# Patient Record
Sex: Male | Born: 1990 | Race: Black or African American | Hispanic: No | Marital: Single | Smoking: Current every day smoker
Health system: Southern US, Community
[De-identification: ages and names within clinical notes are randomized; demographics above are authoritative.]

## PROBLEM LIST (undated history)

## (undated) DIAGNOSIS — I10 Essential (primary) hypertension: Secondary | ICD-10-CM

---
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
## 2010-08-11 ENCOUNTER — Emergency Department: Payer: Self-pay | Admitting: Emergency Medicine

## 2011-12-31 IMAGING — CR DG CHEST 2V
1 series · 2 of 2 positions shown · non-contrast
Comparison: none

REASON FOR EXAM: chest pain
COMMENTS:

[Series 1: view not recorded · 0.17mm/px · 2 of 2 slices shown]
[im 1/2]
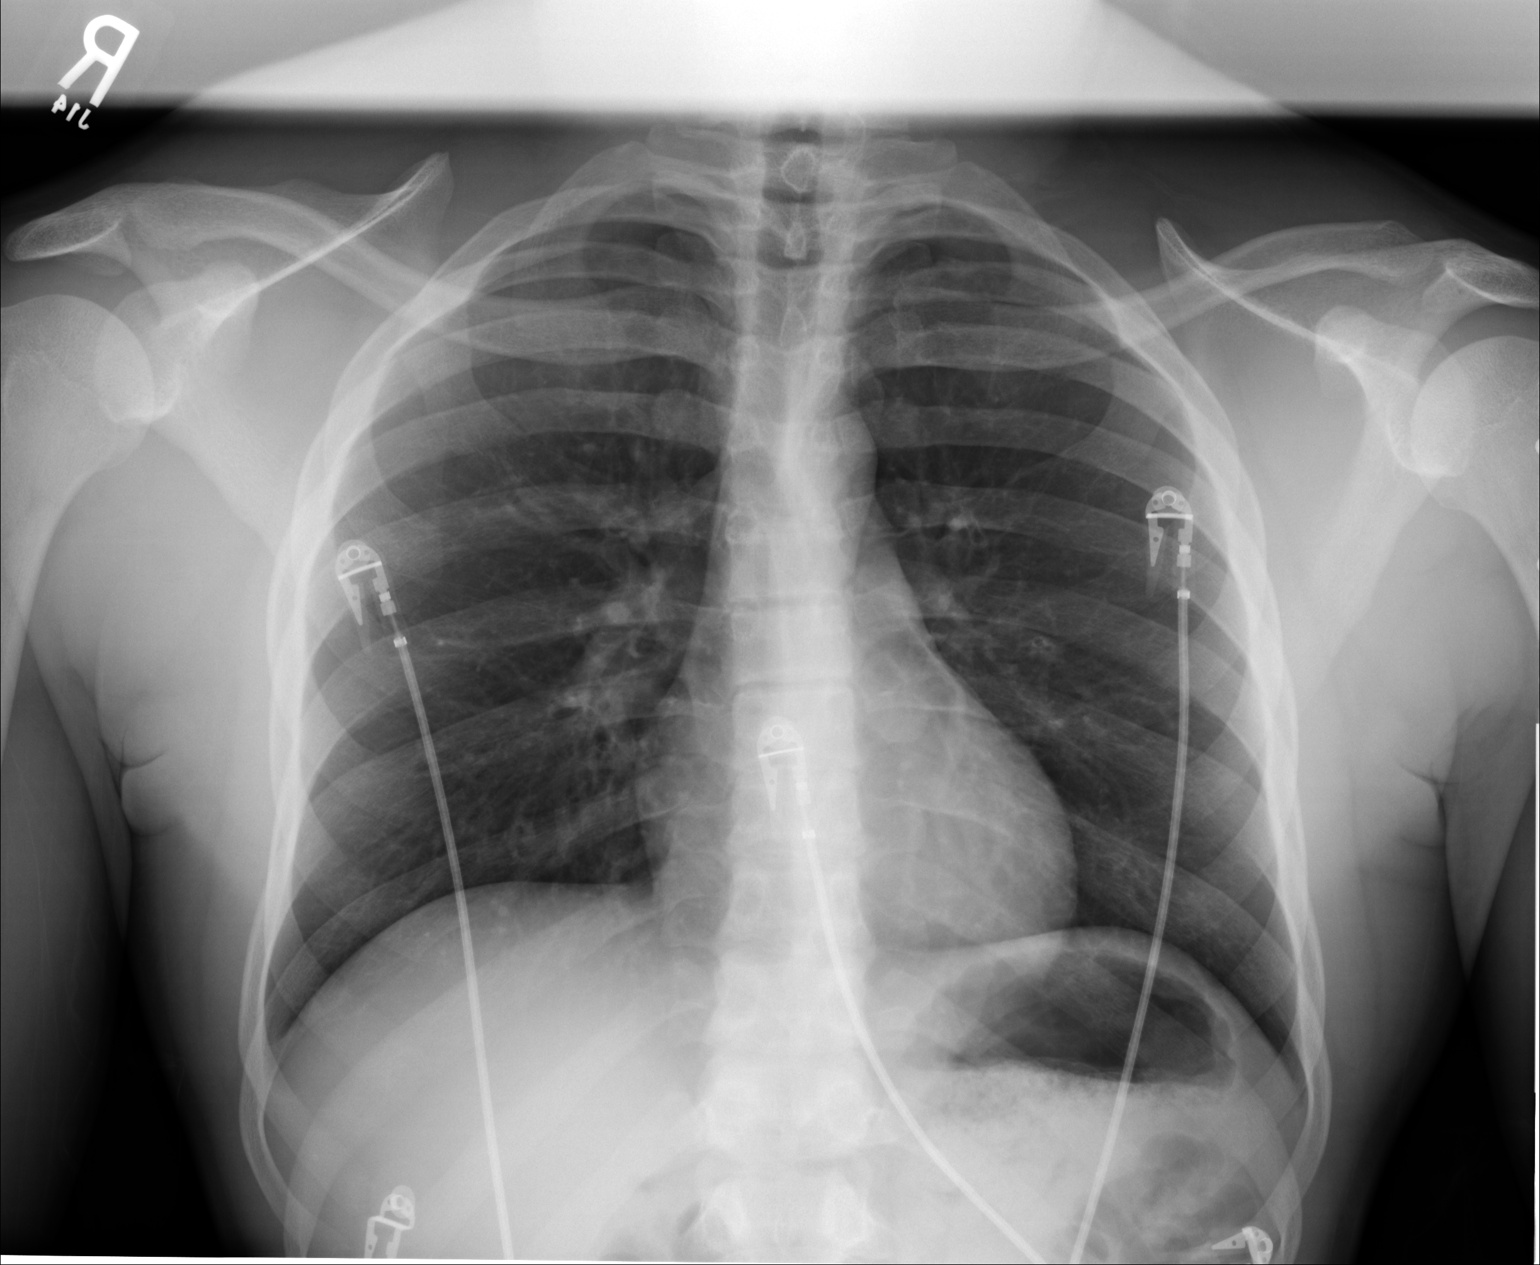
[im 2/2]
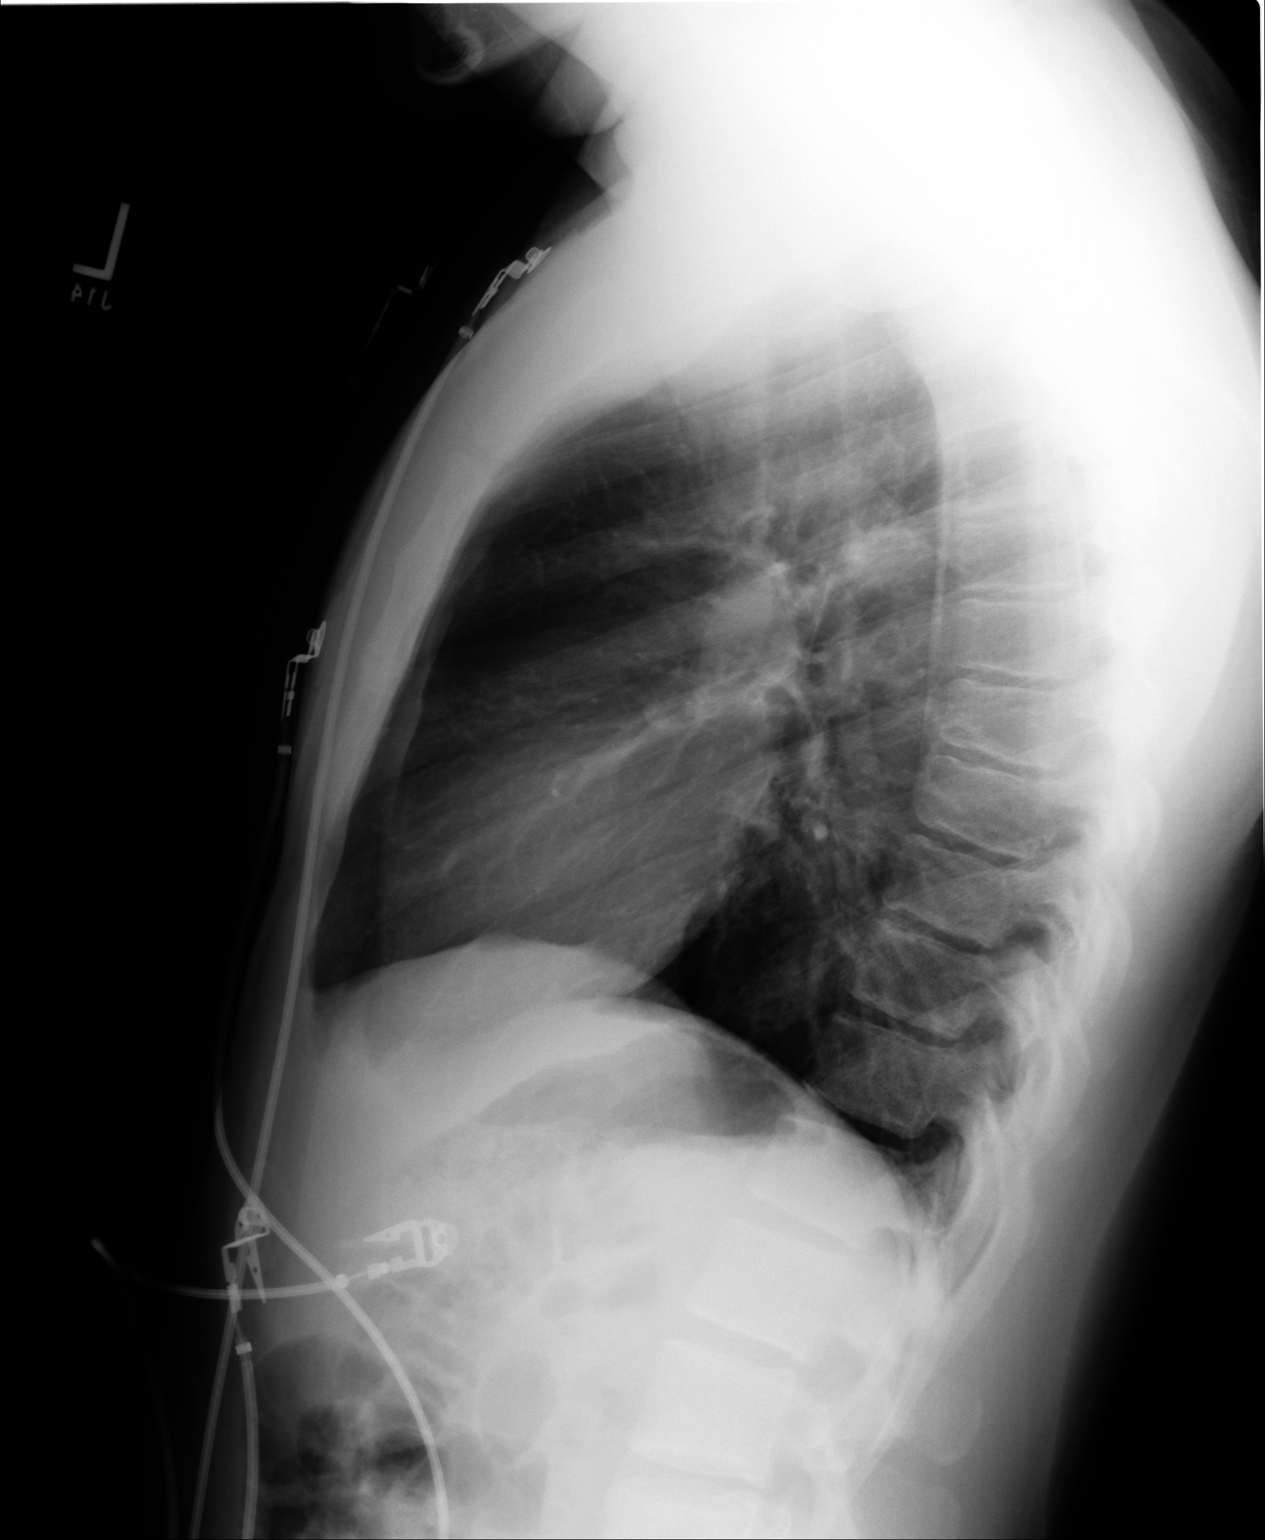

[2 of 2 positions shown; findings below may reference images not displayed]

PROCEDURE:     DXR - DXR CHEST PA (OR AP) AND LATERAL  - August 11, 2010  [DATE]

RESULT:     There is no previous exam for comparison.

Cardiac monitoring electrodes are present. The lungs are clear. The heart
and pulmonary vessels are normal. The bony and mediastinal structures are
unremarkable. There is no effusion. There is no pneumothorax or evidence of
congestive failure.
IMPRESSION: No acute cardiopulmonary disease.

## 2012-05-21 ENCOUNTER — Ambulatory Visit (INDEPENDENT_AMBULATORY_CARE_PROVIDER_SITE_OTHER): Payer: BC Managed Care – PPO | Admitting: Family Medicine

## 2012-05-21 VITALS — BP 152/77 | HR 73 | Temp 98.1°F | Resp 16 | Ht 67.5 in | Wt 173.0 lb

## 2012-05-21 DIAGNOSIS — Z111 Encounter for screening for respiratory tuberculosis: Secondary | ICD-10-CM

## 2012-05-21 NOTE — Progress Notes (Signed)
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 Were you born outside the [place] in one of the following parts of the world:    [place], [place], [place], [place] or [place]?  No  2. Have you traveled outside the [place] and lived for more than one month in one of the following parts of the world:  [place], [place], [place], [place] or [place]?  No  3. Do you have a compromised immune system such as from any of the following conditions:  HIV/AIDS, organ or bone marrow transplantation, diabetes, immunosuppressive   medicines (e.g. Prednisone, Remicaide), leukemia, lymphoma, cancer of the   head or neck, gastrectomy or jejunal bypass, end-stage renal disease (on   dialysis), or silicosis?  No    4. Have you ever done one of the following:    Used crack cocaine, injected illegal drugs, worked or resided in jail or prison,   worked or resided at a homeless shelter, or worked as a Research scientist (physical sciences) in   direct contact with patients?  Yes  Works at [employer]  5. Have you ever been exposed to anyone with infectious tuberculosis?  No   Tuberculosis Symptom Questionnaire  Do you currently have any of the following symptoms?  1. Unexplained cough lasting more than 3 weeks? No  Unexplained fever lasting more than 3 weeks. No   3. Night Sweats (sweating that leaves the bedclothes and sheets wet)   No  4. Shortness of Breath No  5. Chest Pain No  6. Unintentional weight loss  No  7. Unexplained fatigue (very tired for no reason) No

## 2012-10-31 ENCOUNTER — Ambulatory Visit (INDEPENDENT_AMBULATORY_CARE_PROVIDER_SITE_OTHER): Payer: BC Managed Care – PPO | Admitting: Physician Assistant

## 2012-10-31 DIAGNOSIS — J029 Acute pharyngitis, unspecified: Secondary | ICD-10-CM

## 2012-10-31 DIAGNOSIS — H6690 Otitis media, unspecified, unspecified ear: Secondary | ICD-10-CM

## 2012-10-31 MED ORDER — FIRST-DUKES MOUTHWASH MT SUSP: 5.0000 mL | OROMUCOSAL | Status: DC | PRN

## 2012-10-31 MED ORDER — AMOXICILLIN 500 MG PO TABS: 1000.0000 mg | ORAL_TABLET | Freq: Three times a day (TID) | ORAL | Status: DC

## 2012-10-31 NOTE — Patient Instructions (Addendum)
Take amoxicillin twice daily as directed Duke's mouthwash every 2-3 hours as needed for sore throat Increase fluids and rest Continue OTC allergy medication as directed Follow up if symptoms worsen or fail to improve.

## 2012-10-31 NOTE — Progress Notes (Signed)
  Subjective:    Patient ID: Gregory Jones, male    DOB: May 18, 1991, 22 y.o.   MRN: [id number]  HPI 22 year old male presents with 2 day history of sore throat, bilateral ear pain, nasal congestion, and dry, hacking cough.  Admits that he does have a history of ear infections, usually about 3 per year.  Has not noticed any ear drainage or popping.  Did have fever for the first 2 days of his illness (max 102).  Fever has resolved but pain and symptoms have persisted.  He has not taken any OTC medications for this.  No known strep contacts.   He is otherwise healthy with no other concerns today.  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     Review of Systems  Constitutional: Negative for fever and chills.  HENT: Positive for ear pain (bilateral), congestion, sore throat, rhinorrhea and postnasal drip.   Respiratory: Positive for cough. Negative for shortness of breath and wheezing.   Gastrointestinal: Negative for nausea, vomiting and abdominal pain.  Neurological: Negative for dizziness and headaches.       Objective:   Physical Exam  Constitutional: He is oriented to person, place, and time. He appears well-developed and well-nourished.  HENT:  Head: Normocephalic and atraumatic.  Right Ear: Hearing and external ear normal. No drainage. Tympanic membrane is erythematous and bulging. Tympanic membrane is not perforated.  Left Ear: Hearing, external ear and ear canal normal. No drainage. Tympanic membrane is erythematous and bulging. Tympanic membrane is not perforated.  Mouth/Throat: Uvula is midline, oropharynx is clear and moist and mucous membranes are normal.  Eyes: Conjunctivae are normal.  Neck: Normal range of motion.  Cardiovascular: Normal rate, regular rhythm and normal heart sounds.   Pulmonary/Chest: Effort normal and breath sounds normal.  Lymphadenopathy:    He has no cervical adenopathy.  Neurological: He is alert and oriented to person, place, and time.  Psychiatric:  He has a normal mood and affect. His behavior is normal. Judgment and thought content normal.          Assessment & Plan:  Otitis media, bilateral - Plan: amoxicillin (AMOXIL) 500 MG tablet  Acute pharyngitis - Plan: Diphenhyd-Hydrocort-Nystatin (FIRST-DUKES MOUTHWASH) SUSP  Amoxicillin 1 gram twice daily x 10 days Duke's mouthwash q2-3hours prn pain Ibuprofen or tylenol as needed Increase fluids and rest Follow up if symptoms worsen or fail to improve.

## 2012-11-01 ENCOUNTER — Telehealth: Payer: Self-pay | Admitting: Radiology

## 2012-11-01 NOTE — Telephone Encounter (Signed)
Patient provided notes for school and work. Gregory Jones

## 2014-03-23 ENCOUNTER — Emergency Department: Payer: Self-pay | Admitting: Emergency Medicine

## 2014-06-19 ENCOUNTER — Ambulatory Visit (INDEPENDENT_AMBULATORY_CARE_PROVIDER_SITE_OTHER): Payer: BC Managed Care – PPO | Admitting: Internal Medicine

## 2014-06-19 ENCOUNTER — Ambulatory Visit (INDEPENDENT_AMBULATORY_CARE_PROVIDER_SITE_OTHER): Payer: BC Managed Care – PPO

## 2014-06-19 VITALS — BP 163/98 | HR 99 | Resp 16 | Ht 68.0 in | Wt 192.0 lb

## 2014-06-19 DIAGNOSIS — M542 Cervicalgia: Secondary | ICD-10-CM

## 2014-06-19 DIAGNOSIS — M79642 Pain in left hand: Secondary | ICD-10-CM

## 2014-06-19 DIAGNOSIS — S60512A Abrasion of left hand, initial encounter: Secondary | ICD-10-CM

## 2014-06-19 DIAGNOSIS — S80211A Abrasion, right knee, initial encounter: Secondary | ICD-10-CM

## 2014-06-19 DIAGNOSIS — M25561 Pain in right knee: Secondary | ICD-10-CM

## 2014-06-19 DIAGNOSIS — T148XXA Other injury of unspecified body region, initial encounter: Secondary | ICD-10-CM

## 2014-06-19 DIAGNOSIS — M79641 Pain in right hand: Secondary | ICD-10-CM

## 2014-06-19 MED ORDER — OXYCODONE-ACETAMINOPHEN 5-325 MG PO TABS: 1.0000 | ORAL_TABLET | Freq: Four times a day (QID) | ORAL | Status: DC | PRN

## 2014-06-19 MED ORDER — SILVER SULFADIAZINE 1 % EX CREA: 1.0000 "application " | TOPICAL_CREAM | Freq: Every day | CUTANEOUS | Status: DC

## 2014-06-19 MED ORDER — KETOROLAC TROMETHAMINE 60 MG/2ML IM SOLN
60.0000 mg | Freq: Once | INTRAMUSCULAR | Status: AC
  Administered 2014-06-19: 60 mg via INTRAMUSCULAR
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
## 2014-06-21 ENCOUNTER — Telehealth: Payer: Self-pay

## 2014-06-21 NOTE — Telephone Encounter (Signed)
Patient is having pain in legs, patient can barely walk. Also, lacerations are leaking through bandages, they are worried about possible infections. Lastly, patients neck cramps up to where he cannot move it.

## 2014-06-22 ENCOUNTER — Ambulatory Visit (INDEPENDENT_AMBULATORY_CARE_PROVIDER_SITE_OTHER): Payer: BC Managed Care – PPO

## 2014-06-22 ENCOUNTER — Ambulatory Visit (INDEPENDENT_AMBULATORY_CARE_PROVIDER_SITE_OTHER): Payer: BC Managed Care – PPO | Admitting: Family Medicine

## 2014-06-22 VITALS — BP 122/76 | HR 69 | Temp 98.2°F | Resp 17 | Ht 67.5 in | Wt 191.0 lb

## 2014-06-22 DIAGNOSIS — T1490 Injury, unspecified: Secondary | ICD-10-CM

## 2014-06-22 DIAGNOSIS — T07XXXA Unspecified multiple injuries, initial encounter: Secondary | ICD-10-CM

## 2014-06-22 DIAGNOSIS — M79642 Pain in left hand: Secondary | ICD-10-CM

## 2014-06-22 DIAGNOSIS — T1490XA Injury, unspecified, initial encounter: Secondary | ICD-10-CM

## 2014-06-22 DIAGNOSIS — T148 Other injury of unspecified body region: Secondary | ICD-10-CM

## 2014-06-22 DIAGNOSIS — M25561 Pain in right knee: Secondary | ICD-10-CM

## 2014-06-22 DIAGNOSIS — M542 Cervicalgia: Secondary | ICD-10-CM

## 2014-06-22 DIAGNOSIS — T148XXA Other injury of unspecified body region, initial encounter: Secondary | ICD-10-CM

## 2014-06-22 MED ORDER — OXYCODONE-ACETAMINOPHEN 5-325 MG PO TABS: 1.0000 | ORAL_TABLET | Freq: Four times a day (QID) | ORAL | Status: DC | PRN

## 2014-06-22 MED ORDER — LIDOCAINE 5 % EX OINT: 1.0000 "application " | TOPICAL_OINTMENT | CUTANEOUS | Status: DC | PRN

## 2014-06-22 MED ORDER — LIDOCAINE HCL 2 % EX GEL: 1.0000 "application " | CUTANEOUS | Status: DC | PRN

## 2014-06-22 NOTE — Telephone Encounter (Signed)
Spoke to pt- he is going to [facility] today.
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
## 2014-06-22 NOTE — Telephone Encounter (Signed)
The silvadene may cause increased drainage, but he should return to clinic to evaluate this, called to advise, mobile phone disconnected, called home number. Left message for him to come back in.

## 2014-11-01 ENCOUNTER — Encounter (HOSPITAL_COMMUNITY): Payer: Self-pay | Admitting: Family Medicine

## 2014-11-01 ENCOUNTER — Emergency Department (INDEPENDENT_AMBULATORY_CARE_PROVIDER_SITE_OTHER)
Admission: EM | Admit: 2014-11-01 | Discharge: 2014-11-01 | Disposition: A | Discharge status: Home / Self Care | Payer: PRIVATE HEALTH INSURANCE | Source: Home / Self Care | Attending: Family Medicine | Admitting: Family Medicine

## 2014-11-01 DIAGNOSIS — I10 Essential (primary) hypertension: Secondary | ICD-10-CM | POA: Diagnosis not present

## 2014-11-01 DIAGNOSIS — Z720 Tobacco use: Secondary | ICD-10-CM | POA: Diagnosis not present

## 2014-11-01 HISTORY — DX: Essential (primary) hypertension: I10

## 2014-11-01 LAB — POCT I-STAT, CHEM 8
BUN: 15 mg/dL (ref 6–23)
CREATININE: 1 mg/dL (ref 0.50–1.35)
Calcium, Ion: 1.21 mmol/L (ref 1.12–1.23)
Chloride: 103 mmol/L (ref 96–112)
Glucose, Bld: 105 mg/dL — ABNORMAL HIGH (ref 70–99)
HEMATOCRIT: 53 % — AB (ref 39.0–52.0)
Hemoglobin: 18 g/dL — ABNORMAL HIGH (ref 13.0–17.0)
Potassium: 4.2 mmol/L (ref 3.5–5.1)
Sodium: 140 mmol/L (ref 135–145)
TCO2: 23 mmol/L (ref 0–100)

## 2014-11-01 MED ORDER — NICOTINE 21 MG/24HR TD PT24: 21.0000 mg | MEDICATED_PATCH | Freq: Every day | TRANSDERMAL | Status: DC

## 2014-11-01 MED ORDER — HYDROCHLOROTHIAZIDE 12.5 MG PO CAPS: 12.5000 mg | ORAL_CAPSULE | Freq: Every day | ORAL | Status: DC

## 2014-11-01 NOTE — ED Provider Notes (Signed)
CSN: [id number]     Arrival date & time 11/01/14  1216 History   First MD Initiated Contact with Patient 11/01/14 1304     Chief Complaint  Patient presents with  . Hypertension   (Consider location/radiation/quality/duration/timing/severity/associated sxs/prior Treatment) HPI  Chronic history of hypertension. Patient is taking his blood pressure over the last couple of weeks with readings as high as systolic blood pressure of 170-180 and diastolic pressure as high as 95. Denies chest pain, palpitations, shortness of breath, nausea, vomiting, back pain, abdominal pain. Patient states he does with his blood pressure runs high as it oftentimes gives him a headache. Was on HCTZ 0.5 for several years but ran out of this medication and had an insurance change so was unable to go to his PCP. He currently does not have a PCP. But finally has insurance coverage.  Tobacco use: Patient smokes 1 pack per day. Patient actively trying to quit. Patient has not tried anything yet.  Past Medical History  Diagnosis Date  . Hypertension    History reviewed. No pertinent past surgical history. Family History  Problem Relation Age of Onset  . Cancer Neg Hx   . Diabetes Neg Hx   . Heart failure Neg Hx   . Hyperlipidemia Neg Hx   . Hypertension Neg Hx    History  Substance Use Topics  . Smoking status: Current Every Day Smoker -- 2.00 packs/day for 5 years    Types: Cigarettes  . Smokeless tobacco: Not on file  . Alcohol Use: 0.0 oz/week    0 Standard drinks or equivalent per week     Comment: occasionally on the weekend    Review of Systems Per HPI with all other pertinent systems negative.   Allergies  Review of patient's allergies indicates no known allergies.  Home Medications   Prior to Admission medications   Medication Sig Start Date End Date Taking? Authorizing Provider  hydrochlorothiazide (MICROZIDE) 12.5 MG capsule Take 1 capsule (12.5 mg total) by mouth daily. 11/01/14   Ozella Rocksavid J  Keo Schirmer, MD  nicotine (EQ NICOTINE) 21 mg/24hr patch Place 1 patch (21 mg total) onto the skin daily. 11/01/14   Ozella Rocksavid J Obadiah Dennard, MD   BP 133/72 mmHg  Pulse 91  Temp(Src) 98.8 F (37.1 C) (Oral)  Resp 16  SpO2 99% Physical Exam Physical Exam  Constitutional: oriented to person, place, and time. appears well-developed and well-nourished. No distress.  HENT:  Head: Normocephalic and atraumatic.  Eyes: EOMI. PERRL.  Neck: Normal range of motion.  Cardiovascular: RRR, no m/r/g, 2+ distal pulses,  Pulmonary/Chest: Effort normal and breath sounds normal. No respiratory distress.  Abdominal: Soft. Bowel sounds are normal. NonTTP, no distension.  Musculoskeletal: Normal range of motion. Non ttp, no effusion.  Neurological: alert and oriented to person, place, and time.  Skin: Skin is warm. No rash noted. non diaphoretic.  Psychiatric: normal mood and affect. behavior is normal. Judgment and thought content normal.   ED Course  Procedures (including critical care time) Labs Review Labs Reviewed - No data to display  Imaging Review No results found.   MDM   1. Essential hypertension   2. Tobacco use    Chem-8 normal Restart hydrochlorothiazide 12.5 mg daily. Patient to follow-up with PCP for further lab monitoring of blood pressure monitoring. Tobacco cessation counseling given. Patient to try nicotine patch replacement.    Ozella Rocksavid J Benjermin Korber, MD 11/01/14 [phone]

## 2014-11-01 NOTE — ED Notes (Signed)
[facility].  Last night it was 150/95.  BP normal today, but is concerned.  Has had headaches off and on and he thinks it was related to hypertension.  Parents do not have it.

## 2014-11-01 NOTE — Discharge Instructions (Signed)
You need to restart your hydrochlorothiazide for your blood pressure. Please start using the nicotine patch or another form of nicotine replacement to help you quit smoking. Please call your physician's office for follow-up appointment or stop his care with a new physician.

## 2015-11-11 IMAGING — CR DG KNEE COMPLETE 4+V*R*
4 series · 4 of 4 positions shown · non-contrast
Comparison: None.

CLINICAL DATA: Right knee pain.

EXAM:
RIGHT KNEE - COMPLETE 4+ VIEW

[AP]
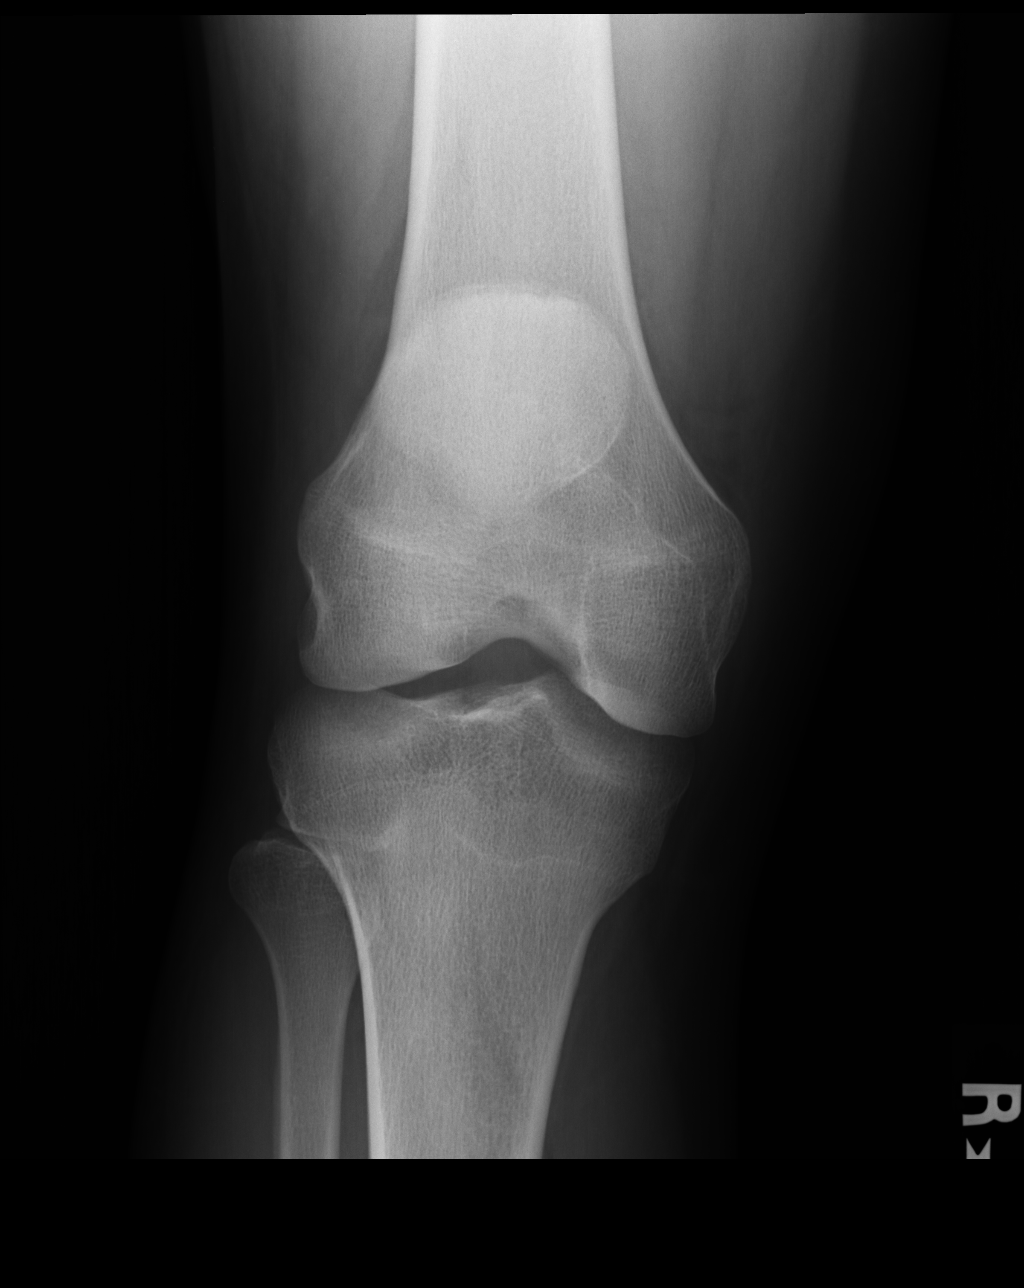

[ap axial]
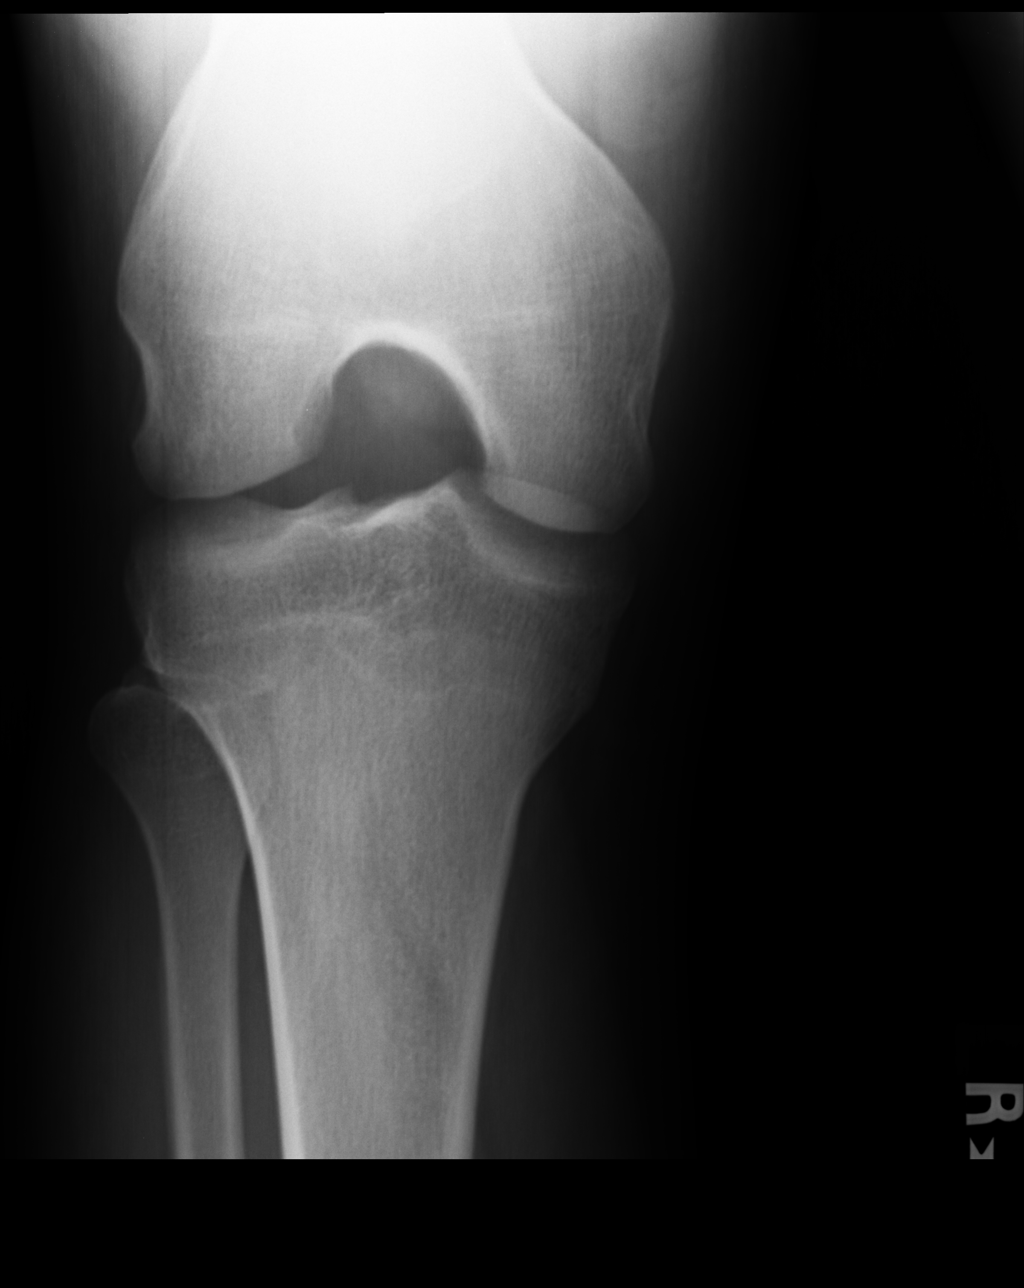

[lateral]
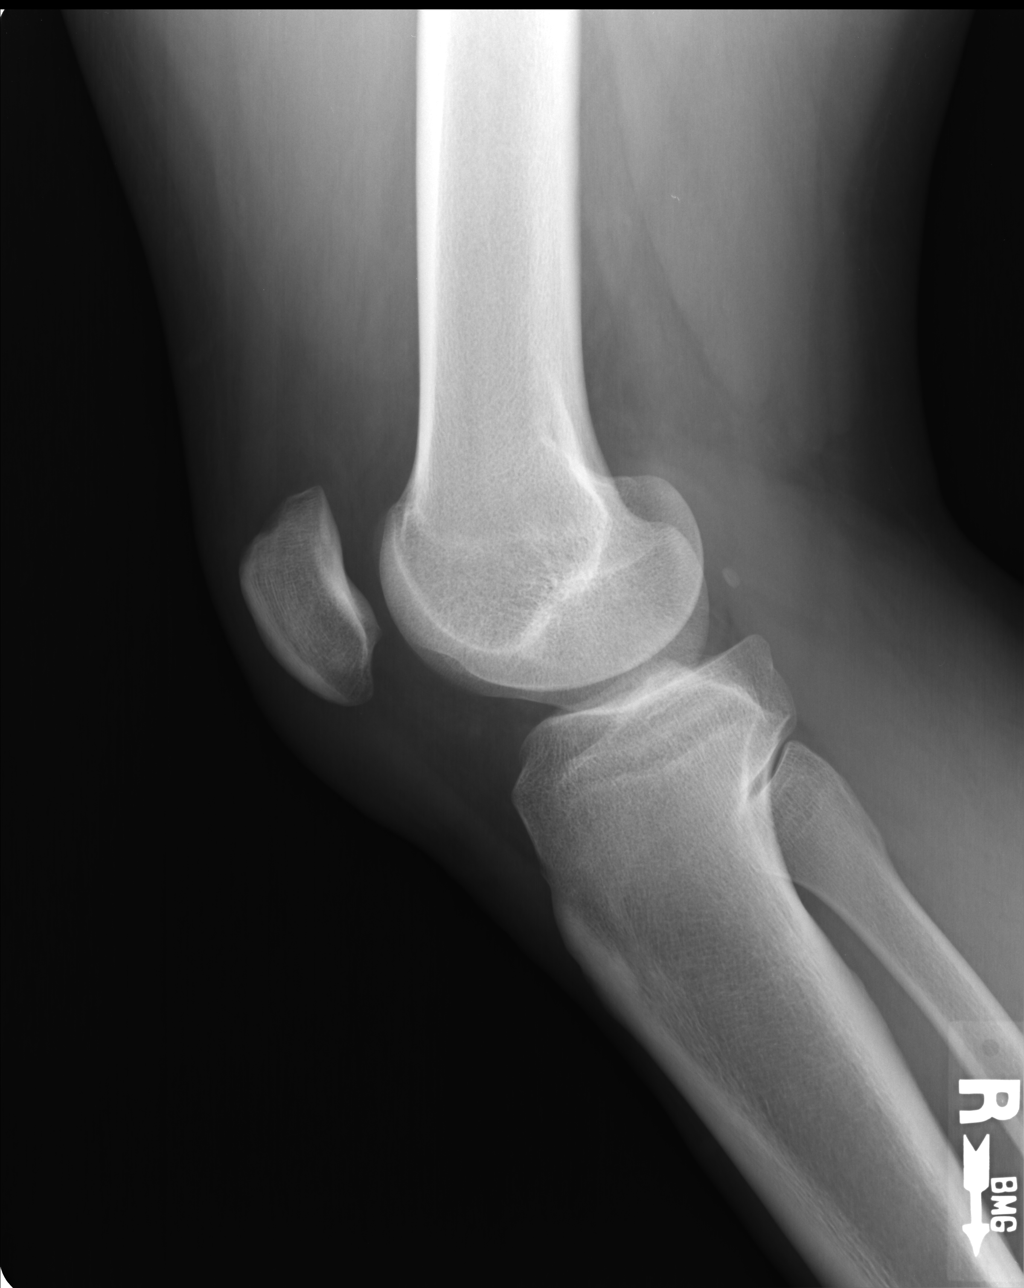

[sunrise]
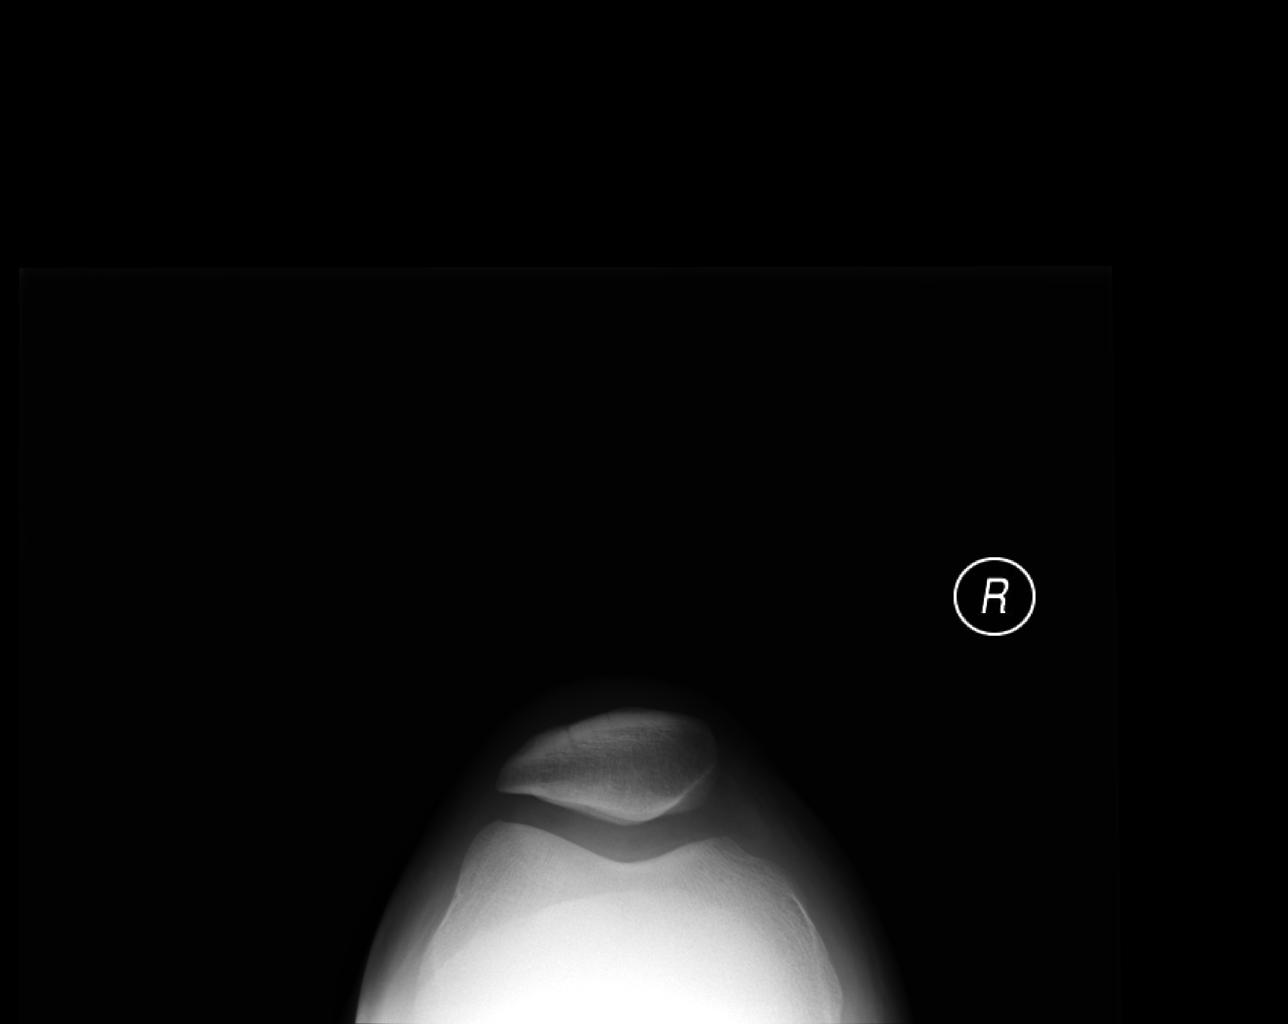

[4 of 4 positions shown; findings below may reference images not displayed]

FINDINGS: No acute bony or joint abnormality identified. No evidence of
fracture . No evidence of dislocation.
IMPRESSION: No acute abnormality.

## 2019-05-09 ENCOUNTER — Emergency Department
Admission: EM | Admit: 2019-05-09 | Discharge: 2019-05-09 | Disposition: A | Discharge status: Home / Self Care | Payer: PRIVATE HEALTH INSURANCE | Attending: Emergency Medicine | Admitting: Emergency Medicine

## 2019-05-09 ENCOUNTER — Other Ambulatory Visit: Payer: Self-pay

## 2019-05-09 ENCOUNTER — Encounter: Payer: Self-pay | Admitting: *Deleted

## 2019-05-09 DIAGNOSIS — Z79899 Other long term (current) drug therapy: Secondary | ICD-10-CM | POA: Insufficient documentation

## 2019-05-09 DIAGNOSIS — Z711 Person with feared health complaint in whom no diagnosis is made: Secondary | ICD-10-CM

## 2019-05-09 DIAGNOSIS — Z113 Encounter for screening for infections with a predominantly sexual mode of transmission: Secondary | ICD-10-CM | POA: Insufficient documentation

## 2019-05-09 DIAGNOSIS — F17210 Nicotine dependence, cigarettes, uncomplicated: Secondary | ICD-10-CM | POA: Insufficient documentation

## 2019-05-09 DIAGNOSIS — I10 Essential (primary) hypertension: Secondary | ICD-10-CM | POA: Insufficient documentation

## 2019-05-09 DIAGNOSIS — Z202 Contact with and (suspected) exposure to infections with a predominantly sexual mode of transmission: Secondary | ICD-10-CM | POA: Insufficient documentation

## 2019-05-09 NOTE — ED Provider Notes (Signed)
[facility] Emergency Department Provider Note  ____________________________________________  Time seen: Approximately 10:59 PM  I have reviewed the triage vital signs and the nursing notes.   HISTORY  Chief Complaint Exposure to STD    HPI Gregory Jones is a 28 y.o. male who presents the emergency department concern for STDs.  Patient has no symptoms at this time but states that he has been having unprotected sex recently.  He is most concerned as he had a casual hookup with someone that he is not very familiar with.  Again, patient denies any symptoms at this time of dysuria, polyuria, hematuria, penile drainage, testicular pain, abdominal pain.  No rashes or lesions.  Patient is here for STD testing only.  No other complaints.         Past Medical History:  Diagnosis Date  . Hypertension     There are no active problems to display for this patient.   History reviewed. No pertinent surgical history.  Prior to Admission medications   Medication Sig Start Date End Date Taking? Authorizing Provider  hydrochlorothiazide (MICROZIDE) 12.5 MG capsule Take 1 capsule (12.5 mg total) by mouth daily. 11/01/14   Ozella Rocks, MD  nicotine (EQ NICOTINE) 21 mg/24hr patch Place 1 patch (21 mg total) onto the skin daily. 11/01/14   Ozella Rocks, MD    Allergies Patient has no known allergies.  Family History  Problem Relation Age of Onset  . Cancer Neg Hx   . Diabetes Neg Hx   . Heart failure Neg Hx   . Hyperlipidemia Neg Hx   . Hypertension Neg Hx     Social History Social History   Tobacco Use  . Smoking status: Current Every Day Smoker    Packs/day: 2.00    Years: 5.00    Pack years: 10.00    Types: Cigarettes  . Smokeless tobacco: Never Used  Substance Use Topics  . Alcohol use: Yes    Alcohol/week: 0.0 standard drinks    Comment: occasionally on the weekend  . Drug use: No     Review of Systems  Constitutional: No  fever/chills Eyes: No visual changes. No discharge ENT: No upper respiratory complaints. Cardiovascular: no chest pain. Respiratory: no cough. No SOB. Gastrointestinal: No abdominal pain.  No nausea, no vomiting.  No diarrhea.  No constipation. Genitourinary: Negative for dysuria. No hematuria.  No testicular pain.  No penile drainage.  No lesions or chancres. Musculoskeletal: Negative for musculoskeletal pain. Skin: Negative for rash, abrasions, lacerations, ecchymosis. Neurological: Negative for headaches, focal weakness or numbness. 10-point ROS otherwise negative.  ____________________________________________   PHYSICAL EXAM:  VITAL SIGNS: ED Triage Vitals  Enc Vitals Group     BP 05/09/19 2143 (!) 160/104     Pulse Rate 05/09/19 2143 71     Resp 05/09/19 2143 18     Temp 05/09/19 2143 98.6 F (37 C)     Temp Source 05/09/19 2143 Oral     SpO2 05/09/19 2143 98 %     Weight 05/09/19 2144 200 lb (90.7 kg)     Height 05/09/19 2144 5\' 7"  (1.702 m)     Head Circumference --      Peak Flow --      Pain Score 05/09/19 2149 0     Pain Loc --      Pain Edu? --      Excl. in GC? --      Constitutional: Alert and oriented. Well appearing and in  no acute distress. Eyes: Conjunctivae are normal. PERRL. EOMI. Head: Atraumatic. ENT:      Ears:       Nose: No congestion/rhinnorhea.      Mouth/Throat: Mucous membranes are moist.  Neck: No stridor.    Cardiovascular: Normal rate, regular rhythm. Normal S1 and S2.  Good peripheral circulation. Respiratory: Normal respiratory effort without tachypnea or retractions. Lungs CTAB. Good air entry to the bases with no decreased or absent breath sounds. Gastrointestinal: Bowel sounds 4 quadrants. Soft and nontender to palpation. No guarding or rigidity. No palpable masses. No distention. No CVA tenderness. Genitourinary: No lesions or chancres identified.  No drainage noted.  No tenderness to palpation. Musculoskeletal: Full range of  motion to all extremities. No gross deformities appreciated. Neurologic:  Normal speech and language. No gross focal neurologic deficits are appreciated.  Skin:  Skin is warm, dry and intact. No rash noted. Psychiatric: Mood and affect are normal. Speech and behavior are normal. Patient exhibits appropriate insight and judgement.   ____________________________________________   LABS (all labs ordered are listed, but only abnormal results are displayed)  Labs Reviewed  GC/CHLAMYDIA PROBE AMP   ____________________________________________  EKG   ____________________________________________  RADIOLOGY   No results found.  ____________________________________________    PROCEDURES  Procedure(s) performed:    Procedures    Medications - No data to display   ____________________________________________   INITIAL IMPRESSION / ASSESSMENT AND PLAN / ED COURSE  Pertinent labs & imaging results that were available during my care of the patient were reviewed by me and considered in my medical decision making (see chart for details).  Review of the [place] CSRS was performed in accordance of the [place] prior to dispensing any controlled drugs.           Patient's diagnosis is consistent with concern about STD without diagnosis.  Patient presented to the emergency department completely asymptomatic but worried about STD as he has had unprotected sex.  Patient has no symptoms at this time.  I discussed that we were able to perform gonorrhea and Chlamydia testing but he would need to present to primary care or health department for complete STD panel.  Patient verbalizes understanding of same.  Will test for gonorrhea and chlamydia but at this time patient will not be prescribed any medications or receive treatment in the emergency department as he is completely asymptomatic.  Follow-up primary care or health department as described above..Patient is given ED precautions to return to  the ED for any worsening or new symptoms.     ____________________________________________  FINAL CLINICAL IMPRESSION(S) / ED DIAGNOSES  Final diagnoses:  Concern about STD in male without diagnosis      NEW MEDICATIONS STARTED DURING THIS VISIT:  ED Discharge Orders    None          This chart was dictated using voice recognition software/Dragon. Despite best efforts to proofread, errors can occur which can change the meaning. Any change was purely unintentional.    Darletta Moll, PA-C 05/09/19 2324    Nance Pear, MD 05/09/19 [phone]

## 2019-05-09 NOTE — ED Notes (Signed)
Pt ambulatory from triage, here for "regular check up for STDs", reports no symptoms

## 2019-05-09 NOTE — ED Notes (Signed)
No peripheral IV placed this visit.   Discharge instructions reviewed with patient. Questions fielded by this RN. Patient verbalizes understanding of instructions. Patient discharged home in stable condition per [place], [place]. No acute distress noted at time of discharge.

## 2019-05-09 NOTE — ED Triage Notes (Signed)
Patient denies any STD symptoms, but reports having unprotected sex and wants to be tested.

## 2019-05-13 LAB — GC/CHLAMYDIA PROBE AMP
Chlamydia trachomatis, NAA: NEGATIVE
Neisseria Gonorrhoeae by PCR: NEGATIVE

## 2020-01-15 ENCOUNTER — Ambulatory Visit (HOSPITAL_COMMUNITY)
Admission: EM | Admit: 2020-01-15 | Discharge: 2020-01-15 | Disposition: A | Discharge status: Home / Self Care | Payer: Self-pay | Attending: Physician Assistant | Admitting: Physician Assistant

## 2020-01-15 ENCOUNTER — Other Ambulatory Visit: Payer: Self-pay

## 2020-01-15 ENCOUNTER — Encounter (HOSPITAL_COMMUNITY): Payer: Self-pay | Admitting: Emergency Medicine

## 2020-01-15 DIAGNOSIS — Z760 Encounter for issue of repeat prescription: Secondary | ICD-10-CM

## 2020-01-15 DIAGNOSIS — I10 Essential (primary) hypertension: Secondary | ICD-10-CM

## 2020-01-15 MED ORDER — HYDROCHLOROTHIAZIDE 12.5 MG PO CAPS: 12.5000 mg | ORAL_CAPSULE | Freq: Every day | ORAL | 0 refills | Status: DC

## 2020-01-15 NOTE — ED Triage Notes (Signed)
Patient has been out of blood pressure medicine for a month.  Patient is in between providers and insurance.    Patient has had headaches and intermittent chest pain and dizziness over the past month

## 2020-01-15 NOTE — Discharge Instructions (Signed)
I have refilled your medicine  For future refills you need to follow up with your Primary care.  I have given another option for PCP as well  If you have chest pain, shortness of breath or severe headache go to the ED

## 2020-01-15 NOTE — ED Provider Notes (Signed)
[facility]    CSN: [id number] Arrival date & time: 01/15/20  0932      History   Chief Complaint Chief Complaint  Patient presents with  . Medication Refill    HPI Gregory Jones is a 29 y.o. male.   Patient reports for medication refill.  He reports he has been out of his hydrochlorothiazide for about 1 month.  He reports he does have a primary care however is looking to switch.  He reports he had his medication last filled in January and there was a refill on the prescription.  He reports he ran out of this about a month ago.  He reports blood pressure is well maintained on [facility].  He reports he can tell his blood pressures been up over the last month as he gets headaches and occasional dizziness.  He reports he was surprised to see his blood pressure as low as it was today and is usually higher than this.  He denies any chest pain or shortness of breath today.  Denies any headaches or dizziness today.  Patient reports he had labs done in January and that they were all normal to his knowledge.  He reports he did not follow-up with his primary care as he is not been able to make an appointment and the provider told him that they required him to come in prior to refilling this prescription.      Past Medical History:  Diagnosis Date  . Hypertension     There are no problems to display for this patient.   History reviewed. No pertinent surgical history.     Home Medications    Prior to Admission medications   Medication Sig Start Date End Date Taking? Authorizing Provider  hydrochlorothiazide (MICROZIDE) 12.5 MG capsule Take 1 capsule (12.5 mg total) by mouth daily. 01/15/20   Jacub Waiters, Marguerita Beards, PA-C  nicotine (EQ NICOTINE) 21 mg/24hr patch Place 1 patch (21 mg total) onto the skin daily. 11/01/14   Waldemar Dickens, MD    Family History Family History  Problem Relation Age of Onset  . Cancer Neg Hx   . Diabetes Neg Hx   . Heart failure Neg Hx   .  Hyperlipidemia Neg Hx   . Hypertension Neg Hx     Social History Social History   Tobacco Use  . Smoking status: Current Every Day Smoker    Packs/day: 2.00    Years: 5.00    Pack years: 10.00    Types: Cigarettes  . Smokeless tobacco: Never Used  Substance Use Topics  . Alcohol use: Yes    Alcohol/week: 0.0 standard drinks    Comment: occasionally on the weekend  . Drug use: No     Allergies   Patient has no known allergies.   Review of Systems Review of Systems   Physical Exam Triage Vital Signs ED Triage Vitals  Enc Vitals Group     BP 01/15/20 0949 138/78     Pulse Rate 01/15/20 0949 73     Resp 01/15/20 0949 18     Temp 01/15/20 0949 98.5 F (36.9 C)     Temp Source 01/15/20 0949 Oral     SpO2 01/15/20 0949 100 %     Weight --      Height --      Head Circumference --      Peak Flow --      Pain Score 01/15/20 0946 6     Pain Loc --  Pain Edu? --      Excl. in GC? --    No data found.  Updated Vital Signs BP 138/78 (BP Location: Right Arm)   Pulse 73   Temp 98.5 F (36.9 C) (Oral)   Resp 18   SpO2 100%   Visual Acuity Right Eye Distance:   Left Eye Distance:   Bilateral Distance:    Right Eye Near:   Left Eye Near:    Bilateral Near:     Physical Exam Vitals and nursing note reviewed.  Constitutional:      Appearance: Normal appearance. He is well-developed. He is not ill-appearing.  HENT:     Head: Normocephalic and atraumatic.  Cardiovascular:     Rate and Rhythm: Normal rate and regular rhythm.     Heart sounds: No murmur heard.   Pulmonary:     Effort: Pulmonary effort is normal. No respiratory distress.     Breath sounds: Normal breath sounds.  Abdominal:     Palpations: Abdomen is soft.     Tenderness: There is no abdominal tenderness.  Musculoskeletal:     Cervical back: Neck supple.     Right lower leg: No edema.     Left lower leg: No edema.  Skin:    General: Skin is warm and dry.     Findings: No rash.    Neurological:     Mental Status: He is alert.      UC Treatments / Results  Labs (all labs ordered are listed, but only abnormal results are displayed) Labs Reviewed - No data to display  EKG   Radiology No results found.  Procedures Procedures (including critical care time)  Medications Ordered in UC Medications - No data to display  Initial Impression / Assessment and Plan / UC Course  I have reviewed the triage vital signs and the nursing notes.  Pertinent labs & imaging results that were available during my care of the patient were reviewed by me and considered in my medical decision making (see chart for details).     #Essential hypertension #Medication refill Patient is a 29 year old with history of hypertension presenting medication refill.  Discussed that I will fill his medication for 30 days however otherwise he will need to follow-up with a primary care as he does have an established primary care for future refills.  I also offered an alternative for his current primary care if he wishes to switch.  I offered to pull basic labs, however patient prefers to follow-up with a primary care provider.  Refilled 12.5 Microzide.  Discussed return emergency department precautions.  Patient verbalized understanding. Final Clinical Impressions(s) / UC Diagnoses   Final diagnoses:  Medication refill  Essential hypertension     Discharge Instructions     I have refilled your medicine  For future refills you need to follow up with your Primary care.  I have given another option for PCP as well  If you have chest pain, shortness of breath or severe headache go to the ED      ED Prescriptions    Medication Sig Dispense Auth. Provider   hydrochlorothiazide (MICROZIDE) 12.5 MG capsule  (Status: Discontinued) Take 1 capsule (12.5 mg total) by mouth daily. 30 capsule Nishita Isaacks, Veryl Speak, PA-C   hydrochlorothiazide (MICROZIDE) 12.5 MG capsule Take 1 capsule (12.5 mg total)  by mouth daily. 30 capsule Remigio Mcmillon, Veryl Speak, PA-C     PDMP not reviewed this encounter.   Hermelinda Medicus, PA-C 01/15/20 2328

## 2020-05-17 ENCOUNTER — Other Ambulatory Visit: Payer: Self-pay

## 2020-05-17 ENCOUNTER — Encounter: Payer: Self-pay | Admitting: Internal Medicine

## 2020-05-17 ENCOUNTER — Ambulatory Visit (INDEPENDENT_AMBULATORY_CARE_PROVIDER_SITE_OTHER): Payer: No Typology Code available for payment source | Admitting: Internal Medicine

## 2020-05-17 VITALS — BP 146/86 | HR 90 | Temp 98.6°F | Ht 66.0 in | Wt 194.1 lb

## 2020-05-17 DIAGNOSIS — F17200 Nicotine dependence, unspecified, uncomplicated: Secondary | ICD-10-CM | POA: Diagnosis not present

## 2020-05-17 DIAGNOSIS — I10 Essential (primary) hypertension: Secondary | ICD-10-CM | POA: Diagnosis not present

## 2020-05-17 MED ORDER — HYDROCHLOROTHIAZIDE 12.5 MG PO CAPS: 12.5000 mg | ORAL_CAPSULE | Freq: Every day | ORAL | 2 refills | Status: DC

## 2020-05-17 MED ORDER — BUPROPION HCL ER (SR) 150 MG PO TB12: 150.0000 mg | ORAL_TABLET | Freq: Two times a day (BID) | ORAL | 2 refills | Status: DC

## 2020-05-17 NOTE — Progress Notes (Signed)
New Patient Office Visit  Subjective:  Patient ID: Gregory Jones, male    DOB: 1991/03/01  Age: 29 y.o. MRN: [id number]  CC: HTN, tobacco use disorder    HPI Gregory Jones presents to establish care for management of HTN and tobacco use disorder. Please see problem based charting for further details.   Past Medical History:  Diagnosis Date  . Hypertension     Surgical history: no prior surgeries.  Family History  Problem Relation Age of Onset  . Cancer Neg Hx   . Diabetes Neg Hx   . Heart failure Neg Hx   . Hyperlipidemia Neg Hx   . Hypertension Neg Hx    Social history: works as a [employer], drinks intermittently on social occasions, has smoked 2 ppd for the last 6 years, no illicit substance use. Makes an effort to workout on the weekends. He is in a relationship but not sexually active.    ROS Review of Systems  Constitutional: Negative for activity change, chills, fever and unexpected weight change.  HENT: Negative for sinus pain and sore throat.   Eyes: Negative for visual disturbance.  Respiratory: Negative for cough and shortness of breath.   Cardiovascular: Negative for chest pain and palpitations.  Gastrointestinal: Negative for abdominal pain, constipation and diarrhea.  Endocrine: Negative for polydipsia, polyphagia and polyuria.  Genitourinary: Negative for difficulty urinating.  Musculoskeletal: Negative for arthralgias and gait problem.  Neurological: Negative for dizziness, weakness and headaches.  Psychiatric/Behavioral: Negative for dysphoric mood and sleep disturbance.    Objective:   Today's Vitals: BP (!) 146/86 (BP Location: Left Arm, Cuff Size: Normal)   Pulse 90   Temp 98.6 F (37 C) (Oral)   Ht 5\' 6"  (1.676 m)   Wt 194 lb 1.6 oz (88 kg)   SpO2 100% Comment: room air  BMI 31.33 kg/m   Physical Exam Constitutional:      General: He is not in acute distress.    Appearance: Normal appearance.  HENT:     Nose: Nose normal.      Mouth/Throat:     Mouth: Mucous membranes are moist.     Pharynx: Oropharynx is clear.  Eyes:     Conjunctiva/sclera: Conjunctivae normal.  Cardiovascular:     Rate and Rhythm: Normal rate and regular rhythm.     Pulses: Normal pulses.     Heart sounds: Normal heart sounds.  Pulmonary:     Effort: Pulmonary effort is normal.     Breath sounds: Normal breath sounds.  Abdominal:     General: Bowel sounds are normal. There is no distension.     Palpations: Abdomen is soft.     Tenderness: There is no abdominal tenderness.  Musculoskeletal:        General: Normal range of motion.     Right lower leg: No edema.     Left lower leg: No edema.  Skin:    General: Skin is warm and dry.  Neurological:     General: No focal deficit present.     Mental Status: He is alert.  Psychiatric:        Mood and Affect: Mood normal.        Behavior: Behavior normal.     Assessment & Plan:   Problem List Items Addressed This Visit      Cardiovascular and Mediastinum   Essential hypertension - Primary    Patient states he was first diagnosed with HTN approximately 3 years ago. He was previously on  Microzide 12.5 mg daily which he tolerated well. He has not been on any medication for several months because he was uninsured and unable to establish with a primary care physician.  Blood pressure is elevated today, but not severe. We discussed lifestyle modifications including continued exercise, weight loss and smoking cessation (see additional problem below).  Will resume anti-hypertensive therapy with Microzide 12.5 mg daily. Return in 4 weeks for repeat blood pressure check and BMP.       Relevant Medications   hydrochlorothiazide (MICROZIDE) 12.5 MG capsule     Other   Tobacco use disorder    Patient currently smoking 2 packs per day and is interested in quitting. He has tried the patches which were not helpful, and Chantix which helped with cessation but had nightmares and disturbing thoughts.   Will try Wellbutrin as alternative therapy. He will start with 150 mg daily x3 days, followed by 150 mg BID thereafter. He was instructed to wait until he was at therapeutic dose for a few days prior to initiating cutback.          Outpatient Encounter Medications as of 05/17/2020  Medication Sig  . buPROPion (WELLBUTRIN SR) 150 MG 12 hr tablet Take 1 tablet (150 mg total) by mouth 2 (two) times daily.  . hydrochlorothiazide (MICROZIDE) 12.5 MG capsule Take 1 capsule (12.5 mg total) by mouth daily.  . nicotine (EQ NICOTINE) 21 mg/24hr patch Place 1 patch (21 mg total) onto the skin daily.  . [DISCONTINUED] hydrochlorothiazide (MICROZIDE) 12.5 MG capsule Take 1 capsule (12.5 mg total) by mouth daily.   No facility-administered encounter medications on file as of 05/17/2020.    Follow-up: Return in about 4 weeks (around 06/14/2020) for BMP, BP re-check .   Bridget Hartshorn, DO

## 2020-05-17 NOTE — Patient Instructions (Signed)
Mr. Krapf, It was nice meeting you! Glad to have you establishing care in our clinic.   Today we discussed:   1. Smoking: I'm glad to hear you want to try and cut back with ultimate goal of quitting. Let's try a different medication called Wellbutrin. Start out by taking it once daily for 3 days. Then increase to twice daily if you are tolerating it well. Do not plan on cutting back on your smoking until you are the full dose of the medication.   2. Blood pressure: I'll start you back on what you were previously taking and see how your blood pressure does. We'll bring you back in 4 weeks for labs and blood pressure re-check.   Take care! Dr. Chesley Mires

## 2020-05-18 ENCOUNTER — Encounter: Payer: Self-pay | Admitting: Internal Medicine

## 2020-05-18 DIAGNOSIS — F17200 Nicotine dependence, unspecified, uncomplicated: Secondary | ICD-10-CM | POA: Insufficient documentation

## 2020-05-18 DIAGNOSIS — I10 Essential (primary) hypertension: Secondary | ICD-10-CM | POA: Insufficient documentation

## 2020-05-18 NOTE — Assessment & Plan Note (Signed)
Patient states he was first diagnosed with HTN approximately 3 years ago. He was previously on Microzide 12.5 mg daily which he tolerated well. He has not been on any medication for several months because he was uninsured and unable to establish with a primary care physician.  Blood pressure is elevated today, but not severe. We discussed lifestyle modifications including continued exercise, weight loss and smoking cessation (see additional problem below).  Will resume anti-hypertensive therapy with Microzide 12.5 mg daily. Return in 4 weeks for repeat blood pressure check and BMP.

## 2020-05-18 NOTE — Assessment & Plan Note (Signed)
Patient currently smoking 2 packs per day and is interested in quitting. He has tried the patches which were not helpful, and Chantix which helped with cessation but had nightmares and disturbing thoughts.  Will try Wellbutrin as alternative therapy. He will start with 150 mg daily x3 days, followed by 150 mg BID thereafter. He was instructed to wait until he was at therapeutic dose for a few days prior to initiating cutback.

## 2020-05-24 NOTE — Progress Notes (Signed)
Internal Medicine Clinic Attending  Case discussed with Dr. Bloomfield  At the time of the visit.  We reviewed the resident's history and exam and pertinent patient test results.  I agree with the assessment, diagnosis, and plan of care documented in the resident's note.  

## 2020-06-14 ENCOUNTER — Other Ambulatory Visit: Payer: Self-pay

## 2020-06-14 ENCOUNTER — Encounter: Payer: Self-pay | Admitting: Student

## 2020-06-14 ENCOUNTER — Ambulatory Visit (INDEPENDENT_AMBULATORY_CARE_PROVIDER_SITE_OTHER): Payer: No Typology Code available for payment source | Admitting: Student

## 2020-06-14 VITALS — BP 139/85 | HR 82 | Temp 98.4°F | Ht 66.0 in | Wt 195.0 lb

## 2020-06-14 DIAGNOSIS — I10 Essential (primary) hypertension: Secondary | ICD-10-CM | POA: Diagnosis not present

## 2020-06-14 DIAGNOSIS — F17210 Nicotine dependence, cigarettes, uncomplicated: Secondary | ICD-10-CM

## 2020-06-14 DIAGNOSIS — F17200 Nicotine dependence, unspecified, uncomplicated: Secondary | ICD-10-CM

## 2020-06-14 NOTE — Progress Notes (Signed)
   CC: f/u visit for BP recheck  HPI:  [name] is a 29 y.o. M with history of HTN and tobacco use disorder presenting to the [facility] for a f/u visit to recheck BP and obtain labs (BMP) after being started on microzide 12.5mg  daily for treatment of HTN. Please see individualized A&P for full HPI.  Past Medical History:  Diagnosis Date  . Hypertension    Review of Systems:    Negative aside from that listed in individualized A&P  Physical Exam:  Vitals:   06/14/20 0839  BP: 139/85  Pulse: 82  Temp: 98.4 F (36.9 C)  TempSrc: Oral  SpO2: 99%  Weight: 195 lb (88.5 kg)  Height: 5\' 6"  (1.676 m)   Physical Exam Constitutional:      Appearance: He is obese.  HENT:     Head: Normocephalic and atraumatic.     Mouth/Throat:     Mouth: Mucous membranes are moist.     Pharynx: Oropharynx is clear. No oropharyngeal exudate.  Eyes:     Extraocular Movements: Extraocular movements intact.     Conjunctiva/sclera: Conjunctivae normal.     Pupils: Pupils are equal, round, and reactive to light.  Cardiovascular:     Rate and Rhythm: Normal rate and regular rhythm.     Pulses: Normal pulses.     Heart sounds: Normal heart sounds. No murmur heard.  No gallop.   Pulmonary:     Effort: Pulmonary effort is normal.     Breath sounds: Normal breath sounds. No wheezing, rhonchi or rales.  Abdominal:     General: Bowel sounds are normal. There is no distension.     Palpations: Abdomen is soft.     Tenderness: There is no abdominal tenderness. There is no guarding or rebound.  Musculoskeletal:        General: No swelling. Normal range of motion.  Skin:    General: Skin is warm and dry.  Neurological:     General: No focal deficit present.     Mental Status: He is alert and oriented to person, place, and time.  Psychiatric:        Mood and Affect: Mood normal.        Behavior: Behavior normal.      Assessment & Plan:   See Encounters Tab for problem based  charting.  Patient seen with Dr. 

## 2020-06-14 NOTE — Assessment & Plan Note (Signed)
Reports that wellbutrin 150mg  BID has not been helping with smoking cessation. Still smoking the same amount as in the past. Reports chantix helped but gave him suicidal thoughts and so that is not an option. He also reports that nicotine patches, gums, vaping all have not helped him with stopping to smoke cigarettes. At this time, we are out of options with pharmacotherapy to help with smoking cessation. He does want to continue with wellbutrin at this time. Discussed gradually decreasing amount of cigarettes he smokes and also picking a target quit date. He does endorse understanding. Given pamphlet on smoking cessation today.  Plan: -continue wellbutrin 150mg  BID -pamphlet on smoking cessation given today -continue smoking cessation counseling at next visit in 3 months

## 2020-06-14 NOTE — Assessment & Plan Note (Signed)
Patient with history of HTN on microzide 12.5mg  daily. BP today 139/85. Endorses that he has not been eating healthy and has not been exercising. Reports that he will start doing both of these. Will continue with current dose of microzide for now and see how diet and exercise help with BP. Will obtain BMP today since microzide was started last month. He still has not been able to stop smoking cigarettes which is likely playing a role in higher BP. No family history of HTN.  Plan: -continue microzide 12.5mg  daily -f/u BMP -given pamphlet for DASH diet and smoking cessation -f/u visit in 3 months

## 2020-06-14 NOTE — Progress Notes (Signed)
Internal Medicine Clinic Attending  I saw and evaluated the patient.  I personally confirmed the key portions of the history and exam documented by Dr. Jinwala and I reviewed pertinent patient test results.  The assessment, diagnosis, and plan were formulated together and I agree with the documentation in the resident's note.  

## 2020-06-14 NOTE — Patient Instructions (Signed)
Mr Gregory Jones,  It was a pleasure seeing you in the clinic today.   We discussed the following today:  1. Please start incorporating a healthier diet and exercise to your daily life to help lower your blood pressure.  2. We are getting some blood work today. I will call you if anything is abnormal.  3. Please continue trying to stop smoking. We will continue with the wellbutrin, but please cut down on the amount and pick a stop date to stop smoking altogether.  Please call our clinic at [phone] if you have any questions or concerns. The best time to call is Monday-Friday from 9am-4pm, but there is someone available 24/7 at the same number. If you need medication refills, please notify your pharmacy one week in advance and they will send [place] a request.   Thank you for letting us take part in your care. We look forward to seeing you next time!    DASH Eating Plan DASH stands for "Dietary Approaches to Stop Hypertension." The DASH eating plan is a healthy eating plan that has been shown to reduce high blood pressure (hypertension). It may also reduce your risk for type 2 diabetes, heart disease, and stroke. The DASH eating plan may also help with weight loss. What are tips for following this plan?  General guidelines  Avoid eating more than 2,300 mg (milligrams) of salt (sodium) a day. If you have hypertension, you may need to reduce your sodium intake to 1,500 mg a day.  Limit alcohol intake to no more than 1 drink a day for nonpregnant women and 2 drinks a day for men. One drink equals 12 oz of beer, 5 oz of wine, or 1 oz of hard liquor.  Work with your health care provider to maintain a healthy body weight or to lose weight. Ask what an ideal weight is for you.  Get at least 30 minutes of exercise that causes your heart to beat faster (aerobic exercise) most days of the week. Activities may include walking, swimming, or biking.  Work with your health care provider or diet and  nutrition specialist (dietitian) to adjust your eating plan to your individual calorie needs. Reading food labels   Check food labels for the amount of sodium per serving. Choose foods with less than 5 percent of the Daily Value of sodium. Generally, foods with less than 300 mg of sodium per serving fit into this eating plan.  To find whole grains, look for the word "whole" as the first word in the ingredient list. Shopping  Buy products labeled as "low-sodium" or "no salt added."  Buy fresh foods. Avoid canned foods and premade or frozen meals. Cooking  Avoid adding salt when cooking. Use salt-free seasonings or herbs instead of table salt or sea salt. Check with your health care provider or pharmacist before using salt substitutes.  Do not fry foods. Cook foods using healthy methods such as baking, boiling, grilling, and broiling instead.  Cook with heart-healthy oils, such as olive, canola, soybean, or sunflower oil. Meal planning  Eat a balanced diet that includes: ? 5 or more servings of fruits and vegetables each day. At each meal, try to fill half of your plate with fruits and vegetables. ? Up to 6-8 servings of whole grains each day. ? Less than 6 oz of lean meat, poultry, or fish each day. A 3-oz serving of meat is about the same size as a deck of cards. One egg equals 1 oz. ? 2  servings of low-fat dairy each day. ? A serving of nuts, seeds, or beans 5 times each week. ? Heart-healthy fats. Healthy fats called Omega-3 fatty acids are found in foods such as flaxseeds and coldwater fish, like sardines, salmon, and mackerel.  Limit how much you eat of the following: ? Canned or prepackaged foods. ? Food that is high in trans fat, such as fried foods. ? Food that is high in saturated fat, such as fatty meat. ? Sweets, desserts, sugary drinks, and other foods with added sugar. ? Full-fat dairy products.  Do not salt foods before eating.  Try to eat at least 2 vegetarian  meals each week.  Eat more home-cooked food and less restaurant, buffet, and fast food.  When eating at a restaurant, ask that your food be prepared with less salt or no salt, if possible. What foods are recommended? The items listed may not be a complete list. Talk with your dietitian about what dietary choices are best for you. Grains Whole-grain or whole-wheat bread. Whole-grain or whole-wheat pasta. Brown rice. Orpah Cobbatmeal. Quinoa. Bulgur. Whole-grain and low-sodium cereals. Pita bread. Low-fat, low-sodium crackers. Whole-wheat flour tortillas. Vegetables Fresh or frozen vegetables (raw, steamed, roasted, or grilled). Low-sodium or reduced-sodium tomato and vegetable juice. Low-sodium or reduced-sodium tomato sauce and tomato paste. Low-sodium or reduced-sodium canned vegetables. Fruits All fresh, dried, or frozen fruit. Canned fruit in natural juice (without added sugar). Meat and other protein foods Skinless chicken or Malawiturkey. Ground chicken or [place]urkey. Pork with fat trimmed off. Fish and seafood. Egg whites. Dried beans, peas, or lentils. Unsalted nuts, nut butters, and seeds. Unsalted canned beans. Lean cuts of beef with fat trimmed off. Low-sodium, lean deli meat. Dairy Low-fat (1%) or fat-free (skim) milk. Fat-free, low-fat, or reduced-fat cheeses. Nonfat, low-sodium ricotta or cottage cheese. Low-fat or nonfat yogurt. Low-fat, low-sodium cheese. Fats and oils Soft margarine without trans fats. Vegetable oil. Low-fat, reduced-fat, or light mayonnaise and salad dressings (reduced-sodium). Canola, safflower, olive, soybean, and sunflower oils. Avocado. Seasoning and other foods Herbs. Spices. Seasoning mixes without salt. Unsalted popcorn and pretzels. Fat-free sweets. What foods are not recommended? The items listed may not be a complete list. Talk with your dietitian about what dietary choices are best for you. Grains Baked goods made with fat, such as croissants, muffins, or some  breads. Dry pasta or rice meal packs. Vegetables Creamed or fried vegetables. Vegetables in a cheese sauce. Regular canned vegetables (not low-sodium or reduced-sodium). Regular canned tomato sauce and paste (not low-sodium or reduced-sodium). Regular tomato and vegetable juice (not low-sodium or reduced-sodium). Rosita FirePickles. Olives. Fruits Canned fruit in a light or heavy syrup. Fried fruit. Fruit in cream or butter sauce. Meat and other protein foods Fatty cuts of meat. Ribs. Fried meat. Tomasa BlaseBacon. Sausage. Bologna and other processed lunch meats. Salami. Fatback. Hotdogs. Bratwurst. Salted nuts and seeds. Canned beans with added salt. Canned or smoked fish. Whole eggs or egg yolks. Chicken or Mal[place]urkey with skin. Dairy Whole or 2% milk, cream, and half-and-half. Whole or full-fat cream cheese. Whole-fat or sweetened yogurt. Full-fat cheese. Nondairy creamers. Whipped toppings. Processed cheese and cheese spreads. Fats and oils Butter. Stick margarine. Lard. Shortening. Ghee. Bacon fat. Tropical oils, such as coconut, palm kernel, or palm oil. Seasoning and other foods Salted popcorn and pretzels. Onion salt, garlic salt, seasoned salt, table salt, and sea salt. Worcestershire sauce. Tartar sauce. Barbecue sauce. Teriyaki sauce. Soy sauce, including reduced-sodium. Steak sauce. Canned and packaged gravies. Fish sauce. Oyster sauce. Cocktail sauce. Horseradish  that you find on the shelf. Ketchup. Mustard. Meat flavorings and tenderizers. Bouillon cubes. Hot sauce and Tabasco sauce. Premade or packaged marinades. Premade or packaged taco seasonings. Relishes. Regular salad dressings. Where to find more information:  National Heart, Lung, and Blood Institute: PopSteam.is  American Heart Association: www.heart.org Summary  The DASH eating plan is a healthy eating plan that has been shown to reduce high blood pressure (hypertension). It may also reduce your risk for type 2 diabetes, heart disease, and  stroke.  With the DASH eating plan, you should limit salt (sodium) intake to 2,300 mg a day. If you have hypertension, you may need to reduce your sodium intake to 1,500 mg a day.  When on the DASH eating plan, aim to eat more fresh fruits and vegetables, whole grains, lean proteins, low-fat dairy, and heart-healthy fats.  Work with your health care provider or diet and nutrition specialist (dietitian) to adjust your eating plan to your individual calorie needs. This information is not intended to replace advice given to you by your health care provider. Make sure you discuss any questions you have with your health care provider. Document Revised: 06/22/2017 Document Reviewed: 07/03/2016 Elsevier Patient Education  2020 [employer].    Coping with Quitting Smoking  Quitting smoking is a physical and mental challenge. You will face cravings, withdrawal symptoms, and temptation. Before quitting, work with your health care provider to make a plan that can help you cope. Preparation can help you quit and keep you from giving in. How can I cope with cravings? Cravings usually last for 5-10 minutes. If you get through it, the craving will pass. Consider taking the following actions to help you cope with cravings:  Keep your mouth busy: ? Chew sugar-free gum. ? Suck on hard candies or a straw. ? Brush your teeth.  Keep your hands and body busy: ? Immediately change to a different activity when you feel a craving. ? Squeeze or play with a ball. ? Do an activity or a hobby, like making bead jewelry, practicing needlepoint, or working with wood. ? Mix up your normal routine. ? Take a short exercise break. Go for a quick walk or run up and down stairs. ? Spend time in public places where smoking is not allowed.  Focus on doing something kind or helpful for someone else.  Call a friend or family member to talk during a craving.  Join a support group.  Call a quit line, such as  1-800-QUIT-NOW.  Talk with your health care provider about medicines that might help you cope with cravings and make quitting easier for you. How can I deal with withdrawal symptoms? Your body may experience negative effects as it tries to get used to not having nicotine in the system. These effects are called withdrawal symptoms. They may include:  Feeling hungrier than normal.  Trouble concentrating.  Irritability.  Trouble sleeping.  Feeling depressed.  Restlessness and agitation.  Craving a cigarette. To manage withdrawal symptoms:  Avoid places, people, and activities that trigger your cravings.  Remember why you want to quit.  Get plenty of sleep.  Avoid coffee and other caffeinated drinks. These may worsen some of your symptoms. How can I handle social situations? Social situations can be difficult when you are quitting smoking, especially in the first few weeks. To manage this, you can:  Avoid parties, bars, and other social situations where people might be smoking.  Avoid alcohol.  Leave right away if you have the  urge to smoke.  Explain to your family and friends that you are quitting smoking. Ask for understanding and support.  Plan activities with friends or family where smoking is not an option. What are some ways I can cope with stress? Wanting to smoke may cause stress, and stress can make you want to smoke. Find ways to manage your stress. Relaxation techniques can help. For example:  Breathe slowly and deeply, in through your nose and out through your mouth.  Listen to soothing, relaxing music.  Talk with a family member or friend about your stress.  Light a candle.  Soak in a bath or take a shower.  Think about a peaceful place. What are some ways I can prevent weight gain? Be aware that many people gain weight after they quit smoking. However, not everyone does. To keep from gaining weight, have a plan in place before you quit and stick to the  plan after you quit. Your plan should include:  Having healthy snacks. When you have a craving, it may help to: ? Eat plain popcorn, crunchy carrots, celery, or other cut vegetables. ? Chew sugar-free gum.  Changing how you eat: ? Eat small portion sizes at meals. ? Eat 4-6 small meals throughout the day instead of 1-2 large meals a day. ? Be mindful when you eat. Do not watch television or do other things that might distract you as you eat.  Exercising regularly: ? Make time to exercise each day. If you do not have time for a long workout, do short bouts of exercise for 5-10 minutes several times a day. ? Do some form of strengthening exercise, like weight lifting, and some form of aerobic exercise, like running or swimming.  Drinking plenty of water or other low-calorie or no-calorie drinks. Drink 6-8 glasses of water daily, or as much as instructed by your health care provider. Summary  Quitting smoking is a physical and mental challenge. You will face cravings, withdrawal symptoms, and temptation to smoke again. Preparation can help you as you go through these challenges.  You can cope with cravings by keeping your mouth busy (such as by chewing gum), keeping your body and hands busy, and making calls to family, friends, or a helpline for people who want to quit smoking.  You can cope with withdrawal symptoms by avoiding places where people smoke, avoiding drinks with caffeine, and getting plenty of rest.  Ask your health care provider about the different ways to prevent weight gain, avoid stress, and handle social situations. This information is not intended to replace advice given to you by your health care provider. Make sure you discuss any questions you have with your health care provider. Document Revised: 06/22/2017 Document Reviewed: 07/07/2016 Elsevier Patient Education  2020 [employer].

## 2020-06-15 LAB — BMP8+ANION GAP
Anion Gap: 18 mmol/L (ref 10.0–18.0)
BUN/Creatinine Ratio: 8 — ABNORMAL LOW (ref 9–20)
BUN: 9 mg/dL (ref 6–20)
CO2: 24 mmol/L (ref 20–29)
Calcium: 10.1 mg/dL (ref 8.7–10.2)
Chloride: 97 mmol/L (ref 96–106)
Creatinine, Ser: 1.06 mg/dL (ref 0.76–1.27)
GFR calc Af Amer: 109 mL/min/{1.73_m2} (ref >59)
GFR calc non Af Amer: 94 mL/min/{1.73_m2} (ref >59)
Glucose: 77 mg/dL (ref 65–99)
Potassium: 4.6 mmol/L (ref 3.5–5.2)
Sodium: 139 mmol/L (ref 134–144)

## 2020-09-01 ENCOUNTER — Telehealth: Payer: Self-pay | Admitting: *Deleted

## 2020-09-01 NOTE — Telephone Encounter (Signed)
Patient called in requesting sooner appt than 09/14/2020. States he had 1 episode of coughing up brb on 08/30/2020. Also had sore throat. Patient smokes 1 PPD. Is not on any blood thinners. Patient has not been tested for covid nor has he received any covid vaccines. Offered appt today but patient declined 2/2 being out of town for his job. He requested appt for Monday. Appt given for 09/06/2020 at 1415. Patient advised to head to nearest ED if develops CP, SHOB, or return of hemoptysis. He is in agreement. Kinnie Feil, BSN, RN-BC

## 2020-09-02 NOTE — Telephone Encounter (Signed)
Agree with plan, thank you!

## 2020-09-06 ENCOUNTER — Telehealth: Payer: Self-pay | Admitting: Internal Medicine

## 2020-09-06 ENCOUNTER — Encounter: Payer: Self-pay | Admitting: Internal Medicine

## 2020-09-06 ENCOUNTER — Other Ambulatory Visit: Payer: Self-pay

## 2020-09-06 ENCOUNTER — Ambulatory Visit: Payer: PRIVATE HEALTH INSURANCE | Admitting: Internal Medicine

## 2020-09-06 VITALS — BP 157/95 | HR 82 | Temp 98.7°F | Ht 67.0 in | Wt 188.7 lb

## 2020-09-06 DIAGNOSIS — D751 Secondary polycythemia: Secondary | ICD-10-CM | POA: Diagnosis not present

## 2020-09-06 DIAGNOSIS — R042 Hemoptysis: Secondary | ICD-10-CM | POA: Diagnosis not present

## 2020-09-06 DIAGNOSIS — I10 Essential (primary) hypertension: Secondary | ICD-10-CM | POA: Diagnosis not present

## 2020-09-06 DIAGNOSIS — F17210 Nicotine dependence, cigarettes, uncomplicated: Secondary | ICD-10-CM

## 2020-09-06 DIAGNOSIS — F17200 Nicotine dependence, unspecified, uncomplicated: Secondary | ICD-10-CM

## 2020-09-06 MED ORDER — HYDROCHLOROTHIAZIDE 25 MG PO TABS: 25.0000 mg | ORAL_TABLET | Freq: Two times a day (BID) | ORAL | 2 refills | Status: DC

## 2020-09-06 MED ORDER — HYDROCHLOROTHIAZIDE 25 MG PO TABS: 25.0000 mg | ORAL_TABLET | Freq: Every day | ORAL | 2 refills | Status: DC

## 2020-09-06 NOTE — Progress Notes (Signed)
   CC: Hypertension, hemoptysis, and tobacco use disorder  HPI:[name] is a 30 y.o. male who presents for evaluation of hypertension, hemoptysis, and tobacco use disorder. Please see individual problem based A/P for details.   Depression, PHQ-9: Based on the patients  Flowsheet Row Office Visit from 09/06/2020 in [facility]  PHQ-9 Total Score 1     score we have does not suggest depression.  Past Medical History:  Diagnosis Date  . Hypertension    Review of Systems:   Review of Systems  Constitutional: Negative for chills and fever.  Gastrointestinal: Negative for abdominal pain, heartburn and vomiting.  Psychiatric/Behavioral: Negative for depression. The patient is nervous/anxious.      Physical Exam: Vitals:   09/06/20 1410  BP: (!) 150/89  Pulse: 85  Temp: 98.7 F (37.1 C)  TempSrc: Oral  SpO2: 98%  Weight: 188 lb 11.2 oz (85.6 kg)  Height: 5\' 7"  (1.702 m)    Physical Exam Constitutional:      General: He is not in acute distress.    Appearance: He is not ill-appearing.  Cardiovascular:     Rate and Rhythm: Normal rate and regular rhythm.     Pulses: Normal pulses.     Heart sounds: No murmur heard.   Pulmonary:     Effort: Pulmonary effort is normal. No respiratory distress.     Breath sounds: No wheezing.  Abdominal:     General: Bowel sounds are normal.     Palpations: Abdomen is soft.  Skin:    General: Skin is warm and dry.  Psychiatric:        Mood and Affect: Mood normal.        Behavior: Behavior normal.        Thought Content: Thought content normal.       Assessment & Plan:   See Encounters Tab for problem based charting.  Patient discussed with Dr. 

## 2020-09-06 NOTE — Telephone Encounter (Signed)
Returned call to [employer] at [employer]. Needed clarification on HCTZ. Discussed with Dr. Barbaraann Faster. Scott notified that correct Rx is for 25 mg once daily. 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 Kinnie Feil, BSN, RN-BC

## 2020-09-06 NOTE — Telephone Encounter (Signed)
Refill Request  Please call back to clarify directions for the Prescriptions sent in for the following:   hydrochlorothiazide (HYDRODIURIL) 25 MG tablet   [id number] - [place], [place] - [address] (Ph: [phone])

## 2020-09-06 NOTE — Patient Instructions (Signed)
Thank you, Gregory Jones for allowing [place] to provide your care today. Today we discussed high blood pressure and tobacco use.  We will increase your medication today.  I recommend cutting back on drinking energy drinks and eating a low-salt diet.  I have ordered the following labs for you:  Lab Orders  No laboratory test(s) ordered today     Tests ordered today:    Referrals ordered today:   Referral Orders  No referral(s) requested today     I have ordered the following medication/changed the following medications:   Stop the following medications: Medications Discontinued During This Encounter  Medication Reason  . nicotine (EQ NICOTINE) 21 mg/24hr patch Patient Preference  . hydrochlorothiazide (MICROZIDE) 12.5 MG capsule      Start the following medications: No orders of the defined types were placed in this encounter.    Follow up: 3 months    Remember: I will follow up with your lab results  Should you have any questions or concerns please call the internal medicine clinic at [phone].      Gregory Jones, M.D. [facility]

## 2020-09-06 NOTE — Assessment & Plan Note (Signed)
Hypertension: Patient's BP today is 150/89 with a goal of <140/80. The patient endorses adherence to his medication regimen.  He is a Naval architect. He reports drinking multiple energy drinks and eating fast food.   Plan: Encouraged lifestyle modifications Increase HCTZ to 25mg  daily

## 2020-09-06 NOTE — Assessment & Plan Note (Signed)
Patient continues smoke 2 packs of cigarettes a day.  He was started on Wellbutrin and would like to continue it.  Likely Wellbutrin can be stopped if patient continues to smoke at next visit.  He has anxiety and suspect Wellbutrin can contribute to his overall anxiety.  Discontinued nicotine patches as he continues to smoke and has not cut back.  Discussed cutting back and putting cigarettes and difficult places.  For example not keeping cigarettes in the cab of his 18 wheeler.   -Continue Wellbutrin 150 mg twice daily, follow-up in 3 months to assess need for medication

## 2020-09-06 NOTE — Assessment & Plan Note (Signed)
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 This could be in setting of tobacco use.  -CBC today

## 2020-09-06 NOTE — Assessment & Plan Note (Signed)
Patient reports one episode of hemoptysis 2 weeks ago.  This was in setting of upper respiratory infection and feeling ill.  Suspect this was from bronchitis which has resolved.  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  Patient denied any shortness of breath, chest pain, or leg pains.

## 2020-09-07 LAB — CBC WITH DIFFERENTIAL/PLATELET
Basophils Absolute: 0 10*3/uL (ref 0.0–0.2)
Basos: 0 %
EOS (ABSOLUTE): 0.1 10*3/uL (ref 0.0–0.4)
Eos: 1 %
Hematocrit: 50.3 % (ref 37.5–51.0)
Hemoglobin: 16.4 g/dL (ref 13.0–17.7)
Immature Grans (Abs): 0 10*3/uL (ref 0.0–0.1)
Immature Granulocytes: 0 %
Lymphocytes Absolute: 3.2 10*3/uL — ABNORMAL HIGH (ref 0.7–3.1)
Lymphs: 44 %
MCH: 27 pg (ref 26.6–33.0)
MCHC: 32.6 g/dL (ref 31.5–35.7)
MCV: 83 fL (ref 79–97)
Monocytes Absolute: 0.6 10*3/uL (ref 0.1–0.9)
Monocytes: 9 %
Neutrophils Absolute: 3.3 10*3/uL (ref 1.4–7.0)
Neutrophils: 46 %
Platelets: 352 10*3/uL (ref 150–450)
RBC: 6.08 x10E6/uL — ABNORMAL HIGH (ref 4.14–5.80)
RDW: 13.5 % (ref 11.6–15.4)
WBC: 7.3 10*3/uL (ref 3.4–10.8)

## 2020-09-07 LAB — BMP8+ANION GAP
Anion Gap: 18 mmol/L (ref 10.0–18.0)
BUN/Creatinine Ratio: 12 (ref 9–20)
BUN: 13 mg/dL (ref 6–20)
CO2: 22 mmol/L (ref 20–29)
Calcium: 10 mg/dL (ref 8.7–10.2)
Chloride: 100 mmol/L (ref 96–106)
Creatinine, Ser: 1.12 mg/dL (ref 0.76–1.27)
GFR calc Af Amer: 102 mL/min/{1.73_m2} (ref >59)
GFR calc non Af Amer: 88 mL/min/{1.73_m2} (ref >59)
Glucose: 78 mg/dL (ref 65–99)
Potassium: 4.4 mmol/L (ref 3.5–5.2)
Sodium: 140 mmol/L (ref 134–144)

## 2020-09-07 NOTE — Progress Notes (Signed)
Called and discussed lab results with patient. Labs are within normal limits.

## 2020-09-09 NOTE — Progress Notes (Signed)
Internal Medicine Clinic Attending  Case discussed with Dr. Steen  At the time of the visit.  We reviewed the resident's history and exam and pertinent patient test results.  I agree with the assessment, diagnosis, and plan of care documented in the resident's note.  

## 2020-09-13 ENCOUNTER — Encounter: Payer: No Typology Code available for payment source | Admitting: Student

## 2020-09-14 ENCOUNTER — Encounter: Payer: No Typology Code available for payment source | Admitting: Student

## 2021-01-03 ENCOUNTER — Encounter: Payer: Self-pay | Admitting: Student

## 2021-01-03 ENCOUNTER — Encounter: Payer: No Typology Code available for payment source | Admitting: Student

## 2021-01-25 ENCOUNTER — Encounter: Payer: Self-pay | Admitting: *Deleted

## 2021-02-07 ENCOUNTER — Ambulatory Visit
Admission: EM | Admit: 2021-02-07 | Discharge: 2021-02-07 | Disposition: A | Discharge status: Home / Self Care | Payer: PRIVATE HEALTH INSURANCE | Attending: Family Medicine | Admitting: Family Medicine

## 2021-02-07 ENCOUNTER — Encounter: Payer: Self-pay | Admitting: Emergency Medicine

## 2021-02-07 ENCOUNTER — Other Ambulatory Visit: Payer: Self-pay

## 2021-02-07 DIAGNOSIS — L2089 Other atopic dermatitis: Secondary | ICD-10-CM | POA: Diagnosis not present

## 2021-02-07 MED ORDER — TRIAMCINOLONE ACETONIDE 0.1 % EX CREA: 1.0000 "application " | TOPICAL_CREAM | Freq: Two times a day (BID) | CUTANEOUS | 0 refills | Status: DC | PRN

## 2021-02-07 MED ORDER — PREDNISONE 20 MG PO TABS: 40.0000 mg | ORAL_TABLET | Freq: Every day | ORAL | 0 refills | Status: DC

## 2021-02-07 MED ORDER — PREDNISONE 20 MG PO TABS
40.0000 mg | ORAL_TABLET | Freq: Every day | ORAL | 0 refills | Status: DC
Start: 2021-02-07 — End: 2021-03-03

## 2021-02-07 NOTE — ED Triage Notes (Signed)
Itchy rash all over body except legs since Wednesday.

## 2021-02-07 NOTE — ED Provider Notes (Signed)
[facility]    CSN: [id number] Arrival date & time: 02/07/21  1802      History   Chief Complaint No chief complaint on file.   HPI Gregory Jones is a 30 y.o. male.   HPI  Patient presents today with a generalized pruritic rash which has been present for over 6 days.  The rash is affecting his entire body with the exception that it is not affecting the legs.  Patient denies any known history of eczema.  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  He has thought he may have contracted scabies or bedbugs.  The rash is nonexcoriated.  He has been using topical cortisone cream without any improvement.  No changes to detergents, soaps or known irritants from foods. Past Medical History:  Diagnosis Date   Hypertension     Patient Active Problem List   Diagnosis Date Noted   Hemoptysis 09/06/2020   Polycythemia 09/06/2020   Tobacco use disorder 05/18/2020   Essential hypertension 05/18/2020    History reviewed. No pertinent surgical history.     Home Medications    Prior to Admission medications   Medication Sig Start Date End Date Taking? Authorizing Provider  buPROPion (WELLBUTRIN SR) 150 MG 12 hr tablet Take 1 tablet (150 mg total) by mouth 2 (two) times daily. 05/17/20 05/17/21  Bloomfield, Carley D, DO  hydrochlorothiazide (HYDRODIURIL) 25 MG tablet Take 1 tablet (25 mg total) by mouth daily. 09/06/20   Albertha Ghee, MD  predniSONE (DELTASONE) 20 MG tablet Take 2 tablets (40 mg total) by mouth daily with breakfast. 02/07/21   Bing Neighbors, FNP  triamcinolone cream (KENALOG) 0.1 % Apply 1 application topically 2 (two) times daily as needed. 02/07/21   Bing Neighbors, FNP    Family History Family History  Problem Relation Age of Onset   Cancer Neg Hx    Diabetes Neg Hx    Heart failure Neg Hx    Hyperlipidemia Neg Hx    Hypertension Neg Hx      Social History Social History   Tobacco Use   Smoking status: Every Day    Packs/day: 2.00    Years: 5.00    Pack years: 10.00    Types: Cigarettes   Smokeless tobacco: Never  Substance Use Topics   Alcohol use: Yes    Alcohol/week: 0.0 standard drinks    Comment: occasionally on the weekend   Drug use: No     Allergies   Patient has no known allergies.   Review of Systems Review of Systems Pertinent negatives listed in HPI   Physical Exam Triage Vital Signs ED Triage Vitals  Enc Vitals Group     BP 02/07/21 1818 129/78     Pulse Rate 02/07/21 1818 78     Resp 02/07/21 1818 18     Temp 02/07/21 1818 98.7 F (37.1 C)     Temp Source 02/07/21 1818 Oral     SpO2 02/07/21 1818 97 %     Weight --      Height --      Head Circumference --      Peak Flow --      Pain Score 02/07/21 1819 0     Pain Loc --      Pain Edu? --      Excl. in GC? --    No data  found.  Updated Vital Signs BP 129/78 (BP Location: Right Arm)   Pulse 78   Temp 98.7 F (37.1 C) (Oral)   Resp 18   SpO2 97%   Visual Acuity Right Eye Distance:   Left Eye Distance:   Bilateral Distance:    Right Eye Near:   Left Eye Near:    Bilateral Near:     Physical Exam General appearance: Alert, cooperative, no distress Head: Normocephalic, without obvious abnormality, atraumatic Respiratory: Respirations even and unlabored, normal respiratory rate Heart: Rate and rhythm normal. No gallop or murmurs noted on exam  Extremities: No gross deformities Skin: Skin color, texture, turgor normal.  Maculopapular rash generalized  Psych: Appropriate mood and affect.  UC Treatments / Results  Labs (all labs ordered are listed, but only abnormal results are displayed) Labs Reviewed - No data to display  EKG   Radiology No results found.  Procedures Procedures (including critical care time)  Medications Ordered in UC Medications - No data to display  Initial Impression / Assessment  and Plan / UC Course  I have reviewed the triage vital signs and the nursing notes.  Pertinent labs & imaging results that were available during my care of the patient were reviewed by me and considered in my medical decision making (see chart for details).     Atopic dermatitis of unknown etiology.  Treatment today with prednisone 40 mg once daily for 5 days.  For acute itching topical triamcinolone cream twice daily as needed.   Final Clinical Impressions(s) / UC Diagnoses   Final diagnoses:  Other atopic dermatitis   Discharge Instructions   None    ED Prescriptions     Medication Sig Dispense Auth. Provider   predniSONE (DELTASONE) 20 MG tablet  (Status: Discontinued) Take 2 tablets (40 mg total) by mouth daily with breakfast. 10 tablet Bing Neighbors, FNP   triamcinolone cream (KENALOG) 0.1 %  (Status: Discontinued) Apply 1 application topically 2 (two) times daily as needed. 454 g Bing Neighbors, FNP   predniSONE (DELTASONE) 20 MG tablet Take 2 tablets (40 mg total) by mouth daily with breakfast. 10 tablet Bing Neighbors, FNP   triamcinolone cream (KENALOG) 0.1 % Apply 1 application topically 2 (two) times daily as needed. 454 g Bing Neighbors, FNP      PDMP not reviewed this encounter.   Bing Neighbors, FNP 02/10/21 1616

## 2021-03-03 ENCOUNTER — Encounter: Payer: Self-pay | Admitting: Internal Medicine

## 2021-03-03 ENCOUNTER — Ambulatory Visit (INDEPENDENT_AMBULATORY_CARE_PROVIDER_SITE_OTHER): Payer: PRIVATE HEALTH INSURANCE | Admitting: Internal Medicine

## 2021-03-03 ENCOUNTER — Other Ambulatory Visit: Payer: Self-pay

## 2021-03-03 DIAGNOSIS — I10 Essential (primary) hypertension: Secondary | ICD-10-CM

## 2021-03-03 MED ORDER — HYDROCHLOROTHIAZIDE 25 MG PO TABS: 25.0000 mg | ORAL_TABLET | Freq: Every day | ORAL | 3 refills | Status: DC

## 2021-03-03 NOTE — Patient Instructions (Signed)
Thank you for trusting me with your care. To recap, today we discussed the following:   1. Essential hypertension - BMP8+Anion Gap - hydrochlorothiazide (HYDRODIURIL) 25 MG tablet; Take 1 tablet (25 mg total) by mouth daily.  Dispense: 90 tablet; Refill: 3

## 2021-03-03 NOTE — Assessment & Plan Note (Addendum)
HPI: Patient's BP today is 143/88 with a goal of <140/80. The patient endorses adherence to his medication regimen. He did drink a energy drink before his appointment. 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  He denied, chest pain, headache, visual changes, lightheadedness, weakness, dizziness on standing, swelling in the feet or ankles.   Assessment/Plan: Essential hypertension - BMP8+Anion Gap - hydrochlorothiazide (HYDRODIURIL) 25 MG tablet; Take 1 tablet (25 mg total) by mouth daily.  Dispense: 90 tablet; Refill: 3

## 2021-03-03 NOTE — Progress Notes (Signed)
   CC: essential hypertension  HPI:Gregory Jones is a 30 y.o. male who presents for evaluation of hypertension. Please see individual problem based A/P for details.   Past Medical History:  Diagnosis Date   Hypertension    Review of Systems:   Review of Systems  Constitutional:  Negative for chills and fever.  Eyes:  Negative for blurred vision and double vision.  Neurological:  Negative for dizziness and headaches.    Physical Exam: Vitals:   03/03/21 1003  BP: (!) 143/88  Pulse: 85  Temp: 98.4 F (36.9 C)  TempSrc: Oral  SpO2: 100%  Weight: 188 lb 14.4 oz (85.7 kg)  Height: 5\' 6"  (1.676 m)   General: Well appearing, NAD HEENT: Conjunctiva nl , antiicteric sclerae, moist mucous membranes, no exudate or erythema Cardiovascular: Normal rate, regular rhythm.  No murmurs, rubs, or gallops Pulmonary : Equal breath sounds, No wheezes, rales, or rhonchi Abdominal: soft, nontender,  bowel sounds present Ext: No edema in lower extremities, no tenderness to palpation of lower extremities.   Assessment & Plan:   See Encounters Tab for problem based charting.  Patient discussed with Dr. 

## 2021-03-04 LAB — BMP8+ANION GAP
Anion Gap: 14 mmol/L (ref 10.0–18.0)
BUN/Creatinine Ratio: 11 (ref 9–20)
BUN: 11 mg/dL (ref 6–20)
CO2: 23 mmol/L (ref 20–29)
Calcium: 9.6 mg/dL (ref 8.7–10.2)
Chloride: 102 mmol/L (ref 96–106)
Creatinine, Ser: 1.03 mg/dL (ref 0.76–1.27)
Glucose: 80 mg/dL (ref 65–99)
Potassium: 4.5 mmol/L (ref 3.5–5.2)
Sodium: 139 mmol/L (ref 134–144)
eGFR: 101 mL/min/{1.73_m2} (ref >59)

## 2021-03-24 NOTE — Progress Notes (Signed)
Internal Medicine Clinic Attending  Case discussed with Dr. Steen  At the time of the visit.  We reviewed the resident's history and exam and pertinent patient test results.  I agree with the assessment, diagnosis, and plan of care documented in the resident's note.  

## 2021-03-29 ENCOUNTER — Other Ambulatory Visit: Payer: Self-pay

## 2021-03-29 DIAGNOSIS — I10 Essential (primary) hypertension: Secondary | ICD-10-CM

## 2021-03-29 NOTE — Telephone Encounter (Signed)
hydrochlorothiazide (HYDRODIURIL) 25 MG tablet, REFILL REQUEST @ [id number] - [place], [place] - [address] AT [address]

## 2021-03-30 MED ORDER — HYDROCHLOROTHIAZIDE 25 MG PO TABS: 25.0000 mg | ORAL_TABLET | Freq: Every day | ORAL | 3 refills | Status: DC

## 2021-12-08 ENCOUNTER — Encounter: Payer: Self-pay | Admitting: *Deleted

## 2021-12-22 ENCOUNTER — Encounter: Payer: Self-pay | Admitting: Student

## 2022-04-05 ENCOUNTER — Other Ambulatory Visit: Payer: Self-pay | Admitting: Student

## 2022-04-05 DIAGNOSIS — I10 Essential (primary) hypertension: Secondary | ICD-10-CM

## 2022-04-05 NOTE — Telephone Encounter (Signed)
LOV 02/2021. Called to schedule an appt  and informed to keep the appt since he come to his appt in June 2023. Call transferred to front office. Appt schedule with Dr Peterson Lombard on 04/28/22.

## 2022-04-28 ENCOUNTER — Ambulatory Visit: Payer: Self-pay

## 2022-04-28 DIAGNOSIS — F17200 Nicotine dependence, unspecified, uncomplicated: Secondary | ICD-10-CM

## 2022-04-28 DIAGNOSIS — F17210 Nicotine dependence, cigarettes, uncomplicated: Secondary | ICD-10-CM

## 2022-04-28 DIAGNOSIS — I10 Essential (primary) hypertension: Secondary | ICD-10-CM

## 2022-04-28 DIAGNOSIS — Z0000 Encounter for general adult medical examination without abnormal findings: Secondary | ICD-10-CM

## 2022-04-28 MED ORDER — BUPROPION HCL ER (SR) 100 MG PO TB12: 100.0000 mg | ORAL_TABLET | Freq: Two times a day (BID) | ORAL | 1 refills | Status: DC

## 2022-04-28 MED ORDER — ALBUTEROL SULFATE HFA 108 (90 BASE) MCG/ACT IN AERS: 2.0000 | INHALATION_SPRAY | Freq: Four times a day (QID) | RESPIRATORY_TRACT | 2 refills | Status: AC | PRN

## 2022-04-28 MED ORDER — LOSARTAN POTASSIUM-HCTZ 50-12.5 MG PO TABS: 1.0000 | ORAL_TABLET | Freq: Every day | ORAL | 2 refills | Status: DC

## 2022-04-28 MED ORDER — ALBUTEROL SULFATE HFA 108 (90 BASE) MCG/ACT IN AERS: 2.0000 | INHALATION_SPRAY | Freq: Four times a day (QID) | RESPIRATORY_TRACT | 2 refills | Status: DC | PRN

## 2022-04-28 NOTE — Assessment & Plan Note (Signed)
Patient continues to use tobacco. Patient smokes approximately 1 ppd. Patient states that he has tried bupropion in the past which helped. He states that he was not taking this medication consistently. Patient has tried pills and patches in the past which do not help due to side effects. Mild diffuse wheezes in all lung fields on exam. 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 He states that he "grew out of this" and no longer uses an inhaler regularly. Denies having episodes of SOB.   Plan: - Ordered buproprion 100 mg BID to help with smoking cessation - Recommended smoking cessation - Will order albuterol inhaler for use as needed

## 2022-04-28 NOTE — Assessment & Plan Note (Signed)
Patient would not like the influenza vaccine today. Patient would not like Hep C screening, HIV screening, or TDAP today because he is going 4-wheeling later today and would not like his arms to be sore. He is comfortable having Hep C screening, HIV screening, and TDAP at his next visit.

## 2022-04-28 NOTE — Progress Notes (Signed)
   CC: regular checkup  HPI:  [name] is a 31 y.o. male with past medical history of HTN and tobacco use disorder that presents for a regular checkup.   Current medications for hypertension include hydrochlorothiazide 25 MG. Patient states that he is compliant with this medication. Patient states that he does not check his BP regularly at home. Patient endorses intermittent diffuse HA that he attributes to dehydration. Patient denies lightheadedness, dizziness, CP, or SOB.   Patient has a history of tobacco use disorder. He continues to use tobacco. Patient smokes approximately 1 ppd. Patient states that he has tried bupropion in the past which helped. He states that he was not taking this medication consistently. Patient has tried pills and patches in the past which have not helped due to side effects.   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 He states that he "grew out of this" and no longer uses an inhaler regularly. Denies having episodes of SOB.   Patient would not like the influenza vaccine today. Patient would not like Hep C screening, HIV screening, or TDAP today because he is going 4-wheeling later today and would not like his arms to be sore. He is comfortable having Hep C screening, HIV screening, and TDAP at his next visit.    Allergies as of 04/28/2022   No Known Allergies      Medication List        Accurate as of April 28, 2022  7:43 AM. If you have any questions, ask your nurse or doctor.          hydrochlorothiazide 25 MG tablet Commonly known as: HYDRODIURIL TAKE 1 TABLET(25 MG) BY MOUTH DAILY         Past Medical History:  Diagnosis Date   Hypertension    Review of Systems:  per HPI.   Physical Exam: Vitals:   04/28/22 0901  BP: (!) 152/94  Pulse: 78  Temp: 98.1 F (36.7 C)  TempSrc: Oral  SpO2: 99%  Weight: 190 lb 12.8 oz (86.5 kg)  Height: 5\' 6"  (1.676 m)     Constitutional: Well-developed, well-nourished, appears comfortable  Cardiovascular: Regular rate, regular rhythm. No murmurs, rubs, or gallops. Normal radial and PT pulses bilaterally. No LE edema.  Pulmonary: Normal respiratory effort. No rales or rhonchi. Mild diffuse wheezes in all lung fields.  Abdominal: Normal bowel sounds.  Musculoskeletal: Normal range of motion.     Neurological: Alert and oriented to person, place, and time. Non-focal. Skin: warm and dry.    Assessment & Plan:   See Encounters Tab for problem based charting.  Patient seen with Dr. Dareen Piano

## 2022-04-28 NOTE — Patient Instructions (Addendum)
Thank you for coming to see [place] in clinic Gregory Jones.   Plan: - We will discontinue hydrochlorothiazide 25 MG for your high blood pressure - We will start you on Losartan-Hydrochlorothiazide 50-12.5 MG for your high blood pressure - We will start buproprion to help you with quitting tobacco use - We will order an albuterol inhaler for your wheezing - We will see you in 4 weeks to draw blood for screening and to check your electrolytes and kidney function  It was very nice to meet you.

## 2022-04-28 NOTE — Progress Notes (Signed)
Internal Medicine Clinic Attending  I saw and evaluated the patient.  I personally confirmed the key portions of the history and exam documented by Dr. Mapp and I reviewed pertinent patient test results.  The assessment, diagnosis, and plan were formulated together and I agree with the documentation in the resident's note.  

## 2022-04-28 NOTE — Assessment & Plan Note (Signed)
Current medications include hydrochlorothiazide 25 MG. Patient states that he is compliant with this medication. Patient states that he does not check his BP regularly at home. Patient endorses intermittent HA that he attributes to dehydration. Patient denies lightheadedness, dizziness, CP, or SOB. Initial BP today is 152/94. Repeat BP is 149/83.    Plan: - Discontinue hydrochlorothiazide 25 MG - Start Losartan-Hydrochlorothiazide 50-12.5 MG - BMP in 4 weeks at next visit to check electrolytes and renal function

## 2022-09-21 ENCOUNTER — Ambulatory Visit: Payer: Self-pay | Admitting: Student

## 2022-09-21 ENCOUNTER — Other Ambulatory Visit: Payer: Self-pay

## 2022-09-21 ENCOUNTER — Encounter: Payer: Self-pay | Admitting: Student

## 2022-09-21 VITALS — BP 136/70 | HR 79 | Temp 98.0°F | Ht 66.0 in | Wt 189.9 lb

## 2022-09-21 DIAGNOSIS — F17210 Nicotine dependence, cigarettes, uncomplicated: Secondary | ICD-10-CM

## 2022-09-21 DIAGNOSIS — Z0000 Encounter for general adult medical examination without abnormal findings: Secondary | ICD-10-CM

## 2022-09-21 DIAGNOSIS — F17200 Nicotine dependence, unspecified, uncomplicated: Secondary | ICD-10-CM

## 2022-09-21 DIAGNOSIS — I10 Essential (primary) hypertension: Secondary | ICD-10-CM

## 2022-09-21 MED ORDER — LOSARTAN POTASSIUM-HCTZ 50-12.5 MG PO TABS: 1.0000 | ORAL_TABLET | Freq: Every day | ORAL | 2 refills | Status: DC

## 2022-09-21 MED ORDER — BUPROPION HCL ER (SR) 100 MG PO TB12: 100.0000 mg | ORAL_TABLET | Freq: Two times a day (BID) | ORAL | 1 refills | Status: DC

## 2022-09-21 MED ORDER — BUPROPION HCL ER (SR) 100 MG PO TB12: 100.0000 mg | ORAL_TABLET | Freq: Two times a day (BID) | ORAL | 2 refills | Status: AC

## 2022-09-21 NOTE — Patient Instructions (Signed)
Mr. Wolpert,  It was nice seeing you in the clinic today.  I am glad you are doing well.  1.  Your blood pressure is well-controlled.  Please continue losartan-HCTZ.  I will check your kidney function today.  2.  Please start taking bupropion to help with smoking cessation.  Please follow-up in 6 months or after you get your insurance.  Dr. Alfonse Spruce

## 2022-09-21 NOTE — Assessment & Plan Note (Signed)
Patient currently smoking 1 pack a day.  He was able to quit with bupropion but started back recently.  He has tried nicotine gum but did not like the taste and nicotine patch gave him skin breakdown.  He is satisfied with the result of bupropion and would like to be back on it.  -Bupropion 100 mg twice daily

## 2022-09-21 NOTE — Assessment & Plan Note (Signed)
-  Will obtain HIV and hep C screening when he obtains insurance

## 2022-09-21 NOTE — Progress Notes (Signed)
   CC: Follow-up on high blood pressure  HPI:  Gregory Jones is a 32 y.o. with hypertension, tobacco use disorder, who presents to the clinic for blood pressure recheck.  Please see problem based charting for detail  Past Medical History:  Diagnosis Date   Hypertension    Review of Systems:  per HPI  Physical Exam:  Vitals:   09/21/22 0946  BP: 136/70  Pulse: 79  Temp: 98 F (36.7 C)  TempSrc: Oral  SpO2: 100%  Weight: 189 lb 14.4 oz (86.1 kg)  Height: 5' 6"$  (1.676 m)   Physical Exam Constitutional:      General: He is not in acute distress.    Appearance: He is not ill-appearing.  Eyes:     General:        Right eye: No discharge.        Left eye: No discharge.     Conjunctiva/sclera: Conjunctivae normal.  Cardiovascular:     Rate and Rhythm: Normal rate and regular rhythm.  Pulmonary:     Effort: Pulmonary effort is normal. No respiratory distress.     Breath sounds: Normal breath sounds. No wheezing.  Musculoskeletal:        General: Normal range of motion.     Cervical back: Normal range of motion.  Skin:    General: Skin is warm.     Coloration: Skin is not jaundiced.  Neurological:     Mental Status: He is alert. Mental status is at baseline.  Psychiatric:        Mood and Affect: Mood normal.      Assessment & Plan:   See Encounters Tab for problem based charting.  Essential hypertension Blood pressure is well-controlled 136/70.  Patient report adherence to losartan-HCTZ.  Taking 1 tablet daily and only missed a few doses.  Patient however last refilled his medication in October for 90-day supplies and still have 2 pills left to date.   Patient is quite young to have high blood pressure requiring 2 medications.  He denies any family history of hypertension.  He attributed his hypertension due to his poor diet and tobacco use.  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  His is smoking 1 pack a day.  High salt  diet and smoking can certainly be a risk factor for hypertension.  His STOP-BANG score is 2 which suggest moderate/severe risk for OSA.  Patient is currently self-pay and will be able to obtain health insurance during enrollment in November.  He would like to hold off on sleep study test until then.  -Refill losartan-HCTZ 50-12.5 mg -Advised patient on low-salt and healthier food options if able -Resume bupropion for smoking cessation -Repeat BMP  Tobacco use disorder Patient currently smoking 1 pack a day.  He was able to quit with bupropion but started back recently.  He has tried nicotine gum but did not like the taste and nicotine patch gave him skin breakdown.  He is satisfied with the result of bupropion and would like to be back on it.  -Bupropion 100 mg twice daily  Healthcare maintenance -Will obtain HIV and hep C screening when he obtains insurance   Patient discussed with Dr. Evette Doffing

## 2022-09-21 NOTE — Assessment & Plan Note (Addendum)
Blood pressure is well-controlled 136/70.  Patient report adherence to losartan-HCTZ.  Taking 1 tablet daily and only missed a few doses.  Patient however last refilled his medication in October for 90-day supplies and still have 2 pills left to date.   Patient is quite young to have high blood pressure requiring 2 medications.  He denies any family history of hypertension.  He attributed his hypertension due to his poor diet and tobacco use.  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  His is smoking 1 pack a day.  High salt diet and smoking can certainly be a risk factor for hypertension.  His STOP-BANG score is 2 which suggest moderate/severe risk for OSA.  Patient is currently self-pay and will be able to obtain health insurance during enrollment in November.  He would like to hold off on sleep study test until then.  -Refill losartan-HCTZ 50-12.5 mg -Advised patient on low-salt and healthier food options if able -Resume bupropion for smoking cessation -Repeat BMP

## 2022-09-22 NOTE — Progress Notes (Signed)
Internal Medicine Clinic Attending  Case discussed with Dr. Nguyen  At the time of the visit.  We reviewed the resident's history and exam and pertinent patient test results.  I agree with the assessment, diagnosis, and plan of care documented in the resident's note. 

## 2022-09-23 LAB — BMP8+ANION GAP
Anion Gap: 16 mmol/L (ref 10.0–18.0)
BUN/Creatinine Ratio: 11 (ref 9–20)
BUN: 11 mg/dL (ref 6–20)
CO2: 22 mmol/L (ref 20–29)
Calcium: 9.4 mg/dL (ref 8.7–10.2)
Chloride: 102 mmol/L (ref 96–106)
Creatinine, Ser: 0.98 mg/dL (ref 0.76–1.27)
Glucose: 79 mg/dL (ref 70–99)
Potassium: 4.2 mmol/L (ref 3.5–5.2)
Sodium: 140 mmol/L (ref 134–144)
eGFR: 106 mL/min/{1.73_m2} (ref >59)

## 2022-12-25 ENCOUNTER — Encounter: Payer: Self-pay | Admitting: *Deleted

## 2023-07-23 ENCOUNTER — Ambulatory Visit: Payer: Self-pay | Admitting: Student

## 2023-07-23 VITALS — BP 135/84 | HR 75 | Temp 98.9°F | Ht 66.0 in | Wt 213.1 lb

## 2023-07-23 DIAGNOSIS — I10 Essential (primary) hypertension: Secondary | ICD-10-CM

## 2023-07-23 DIAGNOSIS — F17211 Nicotine dependence, cigarettes, in remission: Secondary | ICD-10-CM

## 2023-07-23 DIAGNOSIS — F17200 Nicotine dependence, unspecified, uncomplicated: Secondary | ICD-10-CM

## 2023-07-23 MED ORDER — LOSARTAN POTASSIUM-HCTZ 50-12.5 MG PO TABS: 1.0000 | ORAL_TABLET | Freq: Every day | ORAL | 2 refills | Status: DC

## 2023-07-23 NOTE — Patient Instructions (Signed)
Thank you, Mr.Ashton Lovell Sheehan for allowing [place] to provide your care today. Today we discussed your blood pressure, and your general health - I have refilled your blood pressure medication.   - Please return in a month for lab and blood pressure check  - Congratulations on quitting smoking.   I have ordered the following labs for you:  Lab Orders         BMP8+Anion Gap       Tests ordered today:    Referrals ordered today:   Referral Orders  No referral(s) requested today     I have ordered the following medication/changed the following medications:   Stop the following medications: Medications Discontinued During This Encounter  Medication Reason   losartan-hydrochlorothiazide (HYZAAR) 50-12.5 MG tablet Reorder     Start the following medications: Meds ordered this encounter  Medications   losartan-hydrochlorothiazide (HYZAAR) 50-12.5 MG tablet    Sig: Take 1 tablet by mouth daily.    Dispense:  90 tablet    Refill:  2     Follow up:  1 month for nurse only visit for BP check and BMP     Remember:   Should you have any questions or concerns please call the internal medicine clinic at [phone].    Kathleen Lime, M.D [facility]

## 2023-07-23 NOTE — Progress Notes (Signed)
CC: BP follow up visit and medication refill   HPI:  GregoryGregory Jones is a 32 y.o. male living with a history stated below and presents today for follow up on his BP and also refill his medication. Please see problem based assessment and plan for additional details.  Past Medical History:  Diagnosis Date   Hypertension     Current Outpatient Medications on File Prior to Visit  Medication Sig Dispense Refill   albuterol (VENTOLIN HFA) 108 (90 Base) MCG/ACT inhaler Inhale 2 puffs into the lungs every 6 (six) hours as needed for wheezing or shortness of breath. 8 g 2   buPROPion ER (WELLBUTRIN SR) 100 MG 12 hr tablet Take 1 tablet (100 mg total) by mouth 2 (two) times daily. 60 tablet 2   No current facility-administered medications on file prior to visit.    Family History  Problem Relation Age of Onset   Cancer Neg Hx    Diabetes Neg Hx    Heart failure Neg Hx    Hyperlipidemia Neg Hx    Hypertension Neg Hx     Social History   Socioeconomic History   Marital status: Single    Spouse name: Not on file   Number of children: Not on file   Years of education: Not on file   Highest education level: Not on file  Occupational History   Not on file  Tobacco Use   Smoking status: Every Day    Current packs/day: 2.00    Average packs/day: 2.0 packs/day for 5.0 years (10.0 ttl pk-yrs)    Types: Cigarettes   Smokeless tobacco: Never  Substance and Sexual Activity   Alcohol use: Yes    Alcohol/week: 0.0 standard drinks of alcohol    Comment: occasionally on the weekend   Drug use: No   Sexual activity: Not on file  Other Topics Concern   Not on file  Social History Narrative   Not on file   Social Drivers of Health   Financial Resource Strain: Not on file  Food Insecurity: No Food Insecurity (09/21/2022)   Hunger Vital Sign    Worried About Running Out of Food in the Last Year: Never true    Ran Out of Food in the Last Year: Never true  Transportation Needs: No  Transportation Needs (09/21/2022)   PRAPARE - Administrator, Civil Service (Medical): No    Lack of Transportation (Non-Medical): No  Physical Activity: Not on file  Stress: Not on file  Social Connections: Socially Isolated (09/21/2022)   Social Connection and Isolation Panel [NHANES]    Frequency of Communication with Friends and Family: More than three times a week    Frequency of Social Gatherings with Friends and Family: Once a week    Attends Religious Services: Never    Database administrator or Organizations: No    Attends Banker Meetings: Never    Marital Status: Never married  Intimate Partner Violence: Not At Risk (09/21/2022)   Humiliation, Afraid, Rape, and Kick questionnaire    Fear of Current or Ex-Partner: No    Emotionally Abused: No    Physically Abused: No    Sexually Abused: No    Review of Systems: ROS negative except for what is noted on the assessment and plan.  Vitals:   07/23/23 0917 07/23/23 0939  BP: (!) 143/83 135/84  Pulse: 83 75  Temp: 98.9 F (37.2 C)   TempSrc: Oral   SpO2: 98%  Weight: 213 lb 1.6 oz (96.7 kg)   Height: 5\' 6"  (1.676 m)     Physical Exam: Constitutional: well-appearing man, sitting in chair, in no acute distress Cardiovascular: regular rate and rhythm, no m/r/g Pulmonary/Chest: normal work of breathing on room air, lungs clear to auscultation bilaterally MSK: normal bulk and tone Neurological: alert & oriented x 3, no focal deficit Skin: warm and dry Psych: normal mood and behavior  Assessment & Plan:   Essential hypertension BP of 135/84 in the office today. Said he hasn't taken his meds for the past few days. Needs refill. Dispensary records showed he hasn't picked up his medications since May but he reports he is been getting the medication from else where. Patient will benefit from [facility] check but can't afford it today. Patient is scheduled for a BP check  and Labs only visit in a month. Since  patient is no longer smoking cigarettes, his blood pressure could improve and may not need dual antihypertensive therapy. Will reassess his BP at the next visit to see if patient can be switched to monotherapy.  - Refill Hyzaar - Follow up in a month for Nurse only visit for BP and BMP  - Offer medication adherence counseling  - Offer financial assistance paper work to be filled by patient   Tobacco use disorder Hx of Tobacco use. Hasn't smoked in a over 3 months.  - Encourage patient to continue to abstain    Patient seen with Dr. Heloise Beecham, M.D [facility] Phone: [phone] Date 07/23/2023 Time 10:58 AM

## 2023-07-23 NOTE — Assessment & Plan Note (Addendum)
BP of 135/84 in the office today. Said he hasn't taken his meds for the past few days. Needs refill. Dispensary records showed he hasn't picked up his medications since May but he reports he is been getting the medication from else where. Patient will benefit from [facility] check but can't afford it today. Patient is scheduled for a BP check  and Labs only visit in a month. Since patient is no longer smoking cigarettes, his blood pressure could improve and may not need dual antihypertensive therapy. Will reassess his BP at the next visit to see if patient can be switched to monotherapy.  - Refill Hyzaar - Follow up in a month for Nurse only visit for BP and BMP  - Offer medication adherence counseling  - Offer financial assistance paper work to be filled by patient

## 2023-07-23 NOTE — Progress Notes (Signed)
Internal Medicine Clinic Attending  I was physically present during the key portions of the resident provided service and participated in the medical decision making of patient's management care. I reviewed pertinent patient test results.  The assessment, diagnosis, and plan were formulated together and I agree with the documentation in the resident's note.  Mercie Eon, MD   Patient has quit smoking! This is excellent.  We gave him the financial assistance paperwork today - this will be essential for him to complete.  I would like him to get a BMP today but care is limited by his lack of insurance, so we will refill his HTN medication, then plan on BMP in 1 month at RN visit.

## 2023-07-23 NOTE — Addendum Note (Signed)
Addended by: Kathleen Lime on: 07/23/2023 11:05 AM   Modules accepted: Orders

## 2023-07-23 NOTE — Assessment & Plan Note (Addendum)
Hx of Tobacco use. Hasn't smoked in a over 3 months.  - Encourage patient to continue to abstain

## 2023-08-27 ENCOUNTER — Ambulatory Visit: Payer: Medicaid Other | Admitting: *Deleted

## 2023-08-27 DIAGNOSIS — I10 Essential (primary) hypertension: Secondary | ICD-10-CM | POA: Diagnosis present

## 2023-08-27 NOTE — Progress Notes (Signed)
>     Gregory Jones presented today for blood pressure check. Patient is prescribed blood pressure medications and I confirmed that patient did take their blood pressure medication prior to today's appointment. Blood pressure was taken in the usual and appropriate manner using an automated BP cuff.     There were no vitals filed for this visit.  143/81 P 74  Drank an energy drink this morning. Repeat 140 83 P 76     Results of today's visit will be routed to Dr. Mickie Bail for review and further management.    Repeat

## 2023-08-27 NOTE — Progress Notes (Deleted)
B

## 2023-08-28 LAB — BMP8+ANION GAP
Anion Gap: 14 mmol/L (ref 10.0–18.0)
BUN/Creatinine Ratio: 10 (ref 9–20)
BUN: 11 mg/dL (ref 6–20)
CO2: 23 mmol/L (ref 20–29)
Calcium: 9.6 mg/dL (ref 8.7–10.2)
Chloride: 103 mmol/L (ref 96–106)
Creatinine, Ser: 1.05 mg/dL (ref 0.76–1.27)
Glucose: 88 mg/dL (ref 70–99)
Potassium: 4.5 mmol/L (ref 3.5–5.2)
Sodium: 140 mmol/L (ref 134–144)
eGFR: 97 mL/min/{1.73_m2} (ref >59)

## 2023-11-08 ENCOUNTER — Other Ambulatory Visit: Payer: Self-pay

## 2023-11-08 DIAGNOSIS — R112 Nausea with vomiting, unspecified: Secondary | ICD-10-CM | POA: Insufficient documentation

## 2023-11-08 DIAGNOSIS — R059 Cough, unspecified: Secondary | ICD-10-CM | POA: Diagnosis not present

## 2023-11-08 DIAGNOSIS — R1084 Generalized abdominal pain: Secondary | ICD-10-CM | POA: Diagnosis not present

## 2023-11-08 DIAGNOSIS — R0981 Nasal congestion: Secondary | ICD-10-CM | POA: Diagnosis not present

## 2023-11-08 LAB — COMPREHENSIVE METABOLIC PANEL WITH GFR
ALT: 25 U/L (ref 0–44)
AST: 17 U/L (ref 15–41)
Albumin: 4.6 g/dL (ref 3.5–5.0)
Alkaline Phosphatase: 86 U/L (ref 38–126)
Anion gap: 10 (ref 5–15)
BUN: 15 mg/dL (ref 6–20)
CO2: 23 mmol/L (ref 22–32)
Calcium: 9.4 mg/dL (ref 8.9–10.3)
Chloride: 104 mmol/L (ref 98–111)
Creatinine, Ser: 1.16 mg/dL (ref 0.61–1.24)
GFR, Estimated: >60 mL/min (ref >60)
Glucose, Bld: 114 mg/dL — ABNORMAL HIGH (ref 70–99)
Potassium: 3.9 mmol/L (ref 3.5–5.1)
Sodium: 137 mmol/L (ref 135–145)
Total Bilirubin: 0.5 mg/dL (ref 0.0–1.2)
Total Protein: 7.3 g/dL (ref 6.5–8.1)

## 2023-11-08 LAB — CBC
HCT: 44.8 % (ref 39.0–52.0)
Hemoglobin: 14.7 g/dL (ref 13.0–17.0)
MCH: 27.1 pg (ref 26.0–34.0)
MCHC: 32.8 g/dL (ref 30.0–36.0)
MCV: 82.7 fL (ref 80.0–100.0)
Platelets: 293 10*3/uL (ref 150–400)
RBC: 5.42 MIL/uL (ref 4.22–5.81)
RDW: 13.4 % (ref 11.5–15.5)
WBC: 9.7 10*3/uL (ref 4.0–10.5)
nRBC: 0 % (ref 0.0–0.2)

## 2023-11-08 LAB — RESP PANEL BY RT-PCR (RSV, FLU A&B, COVID)  RVPGX2
Influenza A by PCR: NEGATIVE
Influenza B by PCR: NEGATIVE
Resp Syncytial Virus by PCR: NEGATIVE
SARS Coronavirus 2 by RT PCR: NEGATIVE

## 2023-11-08 LAB — LIPASE, BLOOD: Lipase: 10 U/L — ABNORMAL LOW (ref 11–51)

## 2023-11-08 MED ORDER — ONDANSETRON 4 MG PO TBDP
4.0000 mg | ORAL_TABLET | Freq: Once | ORAL | Status: AC | PRN
  Administered 2023-11-08: 4 mg via ORAL
  Filled 2023-11-08: qty 1

## 2023-11-08 NOTE — ED Triage Notes (Signed)
 Emesis several times since getting home at 1800. Chills and body aches.

## 2023-11-09 ENCOUNTER — Encounter (HOSPITAL_BASED_OUTPATIENT_CLINIC_OR_DEPARTMENT_OTHER): Payer: Self-pay

## 2023-11-09 ENCOUNTER — Emergency Department (HOSPITAL_BASED_OUTPATIENT_CLINIC_OR_DEPARTMENT_OTHER)
Admission: EM | Admit: 2023-11-09 | Discharge: 2023-11-09 | Disposition: A | Discharge status: Home / Self Care | Attending: Emergency Medicine | Admitting: Emergency Medicine

## 2023-11-09 DIAGNOSIS — R112 Nausea with vomiting, unspecified: Secondary | ICD-10-CM

## 2023-11-09 MED ORDER — ONDANSETRON 4 MG PO TBDP: ORAL_TABLET | ORAL | 0 refills | Status: AC

## 2023-11-09 MED ORDER — ONDANSETRON 4 MG PO TBDP
8.0000 mg | ORAL_TABLET | Freq: Once | ORAL | Status: AC
  Administered 2023-11-09: 8 mg via ORAL
  Filled 2023-11-09: qty 2

## 2023-11-09 NOTE — ED Provider Notes (Signed)
 [place] EMERGENCY DEPARTMENT AT [facility] Provider Note   CSN: [id number] Arrival date & time: 11/08/23  2153     History  No chief complaint on file.   Gregory Jones is a 33 y.o. male.  Patient presents to the emergency department for evaluation of nausea and vomiting that started around 6 PM.  Patient reports that started after eating a big cheeseburger.  He was unable to hold anything down for some time.  He reports some nasal congestion and a slight nonproductive cough as well.       Home Medications Prior to Admission medications   Medication Sig Start Date End Date Taking? Authorizing Provider  ondansetron  (ZOFRAN -ODT) 4 MG disintegrating tablet 4mg  ODT q4 hours prn nausea/vomit 11/09/23  Yes Gurleen Larrivee, Marine Sia, MD  albuterol  (VENTOLIN  HFA) 108 (90 Base) MCG/ACT inhaler Inhale 2 puffs into the lungs every 6 (six) hours as needed for wheezing or shortness of breath. 04/28/22   Mapp, Tavien, MD  buPROPion  ER (WELLBUTRIN  SR) 100 MG 12 hr tablet Take 1 tablet (100 mg total) by mouth 2 (two) times daily. 09/21/22 12/20/22  Nguyen, Quan, DO  losartan -hydrochlorothiazide  (HYZAAR) 50-12.5 MG tablet Take 1 tablet by mouth daily. 07/23/23 04/18/24  Amoako, Prince, MD      Allergies    Patient has no known allergies.    Review of Systems   Review of Systems  Physical Exam Updated Vital Signs BP 127/85 (BP Location: Left Arm)   Pulse (!) 116   Temp 99.6 F (37.6 C) (Oral)   Resp 19   SpO2 98%  Physical Exam Vitals and nursing note reviewed.  Constitutional:      General: He is not in acute distress.    Appearance: He is well-developed.  HENT:     Head: Normocephalic and atraumatic.     Mouth/Throat:     Mouth: Mucous membranes are moist.  Eyes:     General: Vision grossly intact. Gaze aligned appropriately.     Extraocular Movements: Extraocular movements intact.     Conjunctiva/sclera: Conjunctivae normal.  Cardiovascular:     Rate and Rhythm:  Normal rate and regular rhythm.     Pulses: Normal pulses.     Heart sounds: Normal heart sounds, S1 normal and S2 normal. No murmur heard.    No friction rub. No gallop.  Pulmonary:     Effort: Pulmonary effort is normal. No respiratory distress.     Breath sounds: Normal breath sounds.  Abdominal:     Palpations: Abdomen is soft.     Tenderness: There is no abdominal tenderness. There is no guarding or rebound.     Hernia: No hernia is present.  Musculoskeletal:        General: No swelling.     Cervical back: Full passive range of motion without pain, normal range of motion and neck supple. No pain with movement, spinous process tenderness or muscular tenderness. Normal range of motion.     Right lower leg: No edema.     Left lower leg: No edema.  Skin:    General: Skin is warm and dry.     Capillary Refill: Capillary refill takes less than 2 seconds.     Findings: No ecchymosis, erythema, lesion or wound.  Neurological:     Mental Status: He is alert and oriented to person, place, and time.     GCS: GCS eye subscore is 4. GCS verbal subscore is 5. GCS motor subscore is 6.  Cranial Nerves: Cranial nerves 2-12 are intact.     Sensory: Sensation is intact.     Motor: Motor function is intact. No weakness or abnormal muscle tone.     Coordination: Coordination is intact.  Psychiatric:        Mood and Affect: Mood normal.        Speech: Speech normal.        Behavior: Behavior normal.     ED Results / Procedures / Treatments   Labs (all labs ordered are listed, but only abnormal results are displayed) Labs Reviewed  LIPASE, BLOOD - Abnormal; Notable for the following components:      Result Value   Lipase 10 (*)    All other components within normal limits  COMPREHENSIVE METABOLIC PANEL WITH GFR - Abnormal; Notable for the following components:   Glucose, Bld 114 (*)    All other components within normal limits  RESP PANEL BY RT-PCR (RSV, FLU A&B, COVID)  RVPGX2  CBC   URINALYSIS, ROUTINE W REFLEX MICROSCOPIC    EKG None  Radiology No results found.  Procedures Procedures    Medications Ordered in ED Medications  ondansetron  (ZOFRAN -ODT) disintegrating tablet 8 mg (has no administration in time range)  ondansetron  (ZOFRAN -ODT) disintegrating tablet 4 mg (4 mg Oral Given 11/08/23 2206)    ED Course/ Medical Decision Making/ A&P                                 Medical Decision Making Amount and/or Complexity of Data Reviewed Labs: ordered.  Risk Prescription drug management.   Presents with nausea and vomiting, no diarrhea.  No hematemesis.  Abdominal exam reveals mild diffuse tenderness, no focality.  No guarding, rebound or signs of peritonitis.  Lab work is unremarkable.  Discussed potential we performing advanced imaging such as CAT scan but patient reports that he is feeling better.  He would like to treat symptoms symptomatically with nausea medication and see how he does.  I feel this is reasonable as he does appear well and there are no focal findings.        Final Clinical Impression(s) / ED Diagnoses Final diagnoses:  Nausea and vomiting, unspecified vomiting type    Rx / DC Orders ED Discharge Orders          Ordered    ondansetron  (ZOFRAN -ODT) 4 MG disintegrating tablet        11/09/23 0230              Ballard Bongo, MD 11/09/23 [phone]

## 2023-11-12 ENCOUNTER — Ambulatory Visit: Payer: Self-pay | Admitting: *Deleted

## 2023-11-12 ENCOUNTER — Ambulatory Visit: Admitting: Student

## 2023-11-12 VITALS — BP 143/85 | HR 99 | Temp 98.5°F | Wt 213.8 lb

## 2023-11-12 DIAGNOSIS — J302 Other seasonal allergic rhinitis: Secondary | ICD-10-CM | POA: Diagnosis not present

## 2023-11-12 DIAGNOSIS — R112 Nausea with vomiting, unspecified: Secondary | ICD-10-CM

## 2023-11-12 DIAGNOSIS — T7840XA Allergy, unspecified, initial encounter: Secondary | ICD-10-CM

## 2023-11-12 MED ORDER — FLUTICASONE PROPIONATE 50 MCG/ACT NA SUSP: 1.0000 | Freq: Every day | NASAL | 2 refills | Status: AC

## 2023-11-12 NOTE — Telephone Encounter (Signed)
  Chief Complaint: sinus drainage, nausea, no appetite  Symptoms: see above- parinet states he was seen at ED and vomiting is better- but he is still not feeling well.  Frequency: symptoms started last week  Pertinent Negatives: Patient denies fever, vomiting today Disposition: [] ED /[] Urgent Care (no appt availability in office) / [x] Appointment(In office/virtual)/ []  [facility]/ [] Home Care/ [] Refused Recommended Disposition /[] [facility]/ []  Follow-up with PCP Additional Notes: Patient has been scheduled for appointment today  Copied from CRM [id number]. Topic: Clinical - Red Word Triage >> Nov 12, 2023  8:24 AM Bearl Botts A wrote: Red Word that prompted transfer to Nurse Triage: Patient went to the hospital Friday evening for throwing up. Patient is still having nausea and is not feeing any better. Patient is still throwing up and states he does not know how much more he can take. No appts available at the office until tomorrow. Reason for Disposition  [1] Using nasal washes and pain medicine > 24 hours AND [2] sinus pain (around cheekbone or eye) persists  Answer Assessment - Initial Assessment Questions 1. LOCATION: "Where does it hurt?"      Congestion and drainage, nausea 2. ONSET: "When did the sinus pain start?"  (e.g., hours, days)      On/off- using Claritin, nausea 3. SEVERITY: "How bad is the pain?"   (Scale 1-10; mild, moderate or severe)   - MILD (1-3): doesn't interfere with normal activities    - MODERATE (4-7): interferes with normal activities (e.g., work or school) or awakens from sleep   - SEVERE (8-10): excruciating pain and patient unable to do any normal activities        Stomach sore- from vomiting, comes and goes 4. RECURRENT SYMPTOM: "Have you ever had sinus problems before?" If Yes, ask: "When was the last time?" and "What happened that time?"      Patient has never had problems this bad with allergy, sinus drainage , and nausea/vomiting 5.  NASAL CONGESTION: "Is the nose blocked?" If Yes, ask: "Can you open it or must you breathe through your mouth?"     Nasal passage get blocked at times 6. NASAL DISCHARGE: "Do you have discharge from your nose?" If so ask, "What color?"     Yellow mucus 7. FEVER: "Do you have a fever?" If Yes, ask: "What is it, how was it measured, and when did it start?"      no 8. OTHER SYMPTOMS: "Do you have any other symptoms?" (e.g., sore throat, cough, earache, difficulty breathing)     Headache, nausea, sore throat- itchy  Protocols used: Sinus Pain or Congestion-A-AH

## 2023-11-12 NOTE — Progress Notes (Signed)
 Established Patient Office Visit  Subjective   Patient ID: Gregory Jones, male    DOB: 05/04/1991  Age: 33 y.o. MRN: [id number]  Chief Complaint  Patient presents with   Follow-up    ED follow up-N&V  Still feels bad, decreased appetite, still nauseated, chills (off and on), head feels congested  Taking Claritin with no relief     Gregory Jones is a 33 y.o. who presents to the clinic for an ED follow up for Nausea and vomiting on 04/17.  Per the patient, he ate a cheeseburger at lunch and then around 6 PM he felt very nauseated and started vomiting.  He denies any abdominal pain at that time.  In the emergency department, laboratory work was reassuring and he was discharged home with Zofran .  Patient reports that his nausea and vomiting started to improve this morning.  His appetite is still poor; however, he is able to drink water and keep food down now.  He reports that the Zofran  is helping him and he still has plenty.  He denies abdominal pain, diarrhea, sick contacts, fever, chills, marijuana use, alcohol use.  Additionally, patient reports sinus congestion that has been off and on since March when the pollen count was elevated.  He has tried Claritin and reported that he is taking it as needed.  He has not tried Flonase  yet.   Please see problem based assessment and plan for additional details.   Patient Active Problem List   Diagnosis Date Noted   Nausea & vomiting 11/13/2023   Allergies 11/13/2023   Healthcare maintenance 04/28/2022   Hemoptysis 09/06/2020   Polycythemia 09/06/2020   Tobacco use disorder 05/18/2020   Essential hypertension 05/18/2020       Objective:     BP (!) 143/85 (BP Location: Left Arm, Patient Position: Sitting, Cuff Size: Normal)   Pulse 99   Temp 98.5 F (36.9 C) (Oral)   Wt 213 lb 12.8 oz (97 kg)   SpO2 99%   BMI 34.51 kg/m  BP Readings from Last 3 Encounters:  11/12/23 (!) 143/85  11/09/23 131/80  08/27/23 (!) 140/83   Wt  Readings from Last 3 Encounters:  11/12/23 213 lb 12.8 oz (97 kg)  07/23/23 213 lb 1.6 oz (96.7 kg)  09/21/22 189 lb 14.4 oz (86.1 kg)      Physical Exam Vitals reviewed.  Constitutional:      General: He is not in acute distress.    Appearance: He is obese. He is not ill-appearing, toxic-appearing or diaphoretic.  Cardiovascular:     Rate and Rhythm: Normal rate and regular rhythm.     Heart sounds: Normal heart sounds. No murmur heard. Pulmonary:     Effort: Pulmonary effort is normal. No respiratory distress.     Breath sounds: Normal breath sounds. No wheezing.  Abdominal:     General: Bowel sounds are normal. There is no distension.     Palpations: Abdomen is soft.     Tenderness: There is no abdominal tenderness. There is no guarding.  Skin:    General: Skin is warm and dry.  Neurological:     Mental Status: He is alert.  Psychiatric:        Mood and Affect: Mood and affect normal.        Speech: Speech normal.        Behavior: Behavior normal. Behavior is cooperative.      No results found for any visits on 11/12/23.  Last metabolic panel Lab  Results  Component Value Date   GLUCOSE 114 (H) 11/08/2023   NA 137 11/08/2023   K 3.9 11/08/2023   CL 104 11/08/2023   CO2 23 11/08/2023   BUN 15 11/08/2023   CREATININE 1.16 11/08/2023   GFRNONAA >60 11/08/2023   CALCIUM 9.4 11/08/2023   PROT 7.3 11/08/2023   ALBUMIN 4.6 11/08/2023   BILITOT 0.5 11/08/2023   ALKPHOS 86 11/08/2023   AST 17 11/08/2023   ALT 25 11/08/2023   ANIONGAP 10 11/08/2023      Assessment & Plan:   Problem List Items Addressed This Visit       Digestive   Nausea & vomiting - Primary   Per the patient, he ate a cheeseburger at lunch and then around 6 PM he felt very nauseated and started vomiting.  He denies any abdominal pain at that time.  In the emergency department, laboratory work was reassuring and he was discharged home with Zofran .  Patient reports that his nausea and vomiting  started to improve this morning.  His appetite is still poor; however, he is able to drink water and keep food down now.  He reports that the Zofran  is helping him and he still has plenty.  He denies abdominal pain, diarrhea, sick contacts, fever, chills, marijuana use, alcohol use.  I suspect that his nausea and vomiting was likely due to a gastroenteritis that is now resolving. Plan: -Patient encouraged to continue hydration and to start a bland diet for a few days -Patient does not need refill for Zofran  at this time         Other   Allergies   Patient reports sinus congestion that has been off and on since March when the pollen count was elevated.  He has tried Claritin and reported that he is taking it as needed.  He has not tried Flonase  yet.  Patient's symptoms are due to seasonal allergies. Low suspicion for a bacterial sinus infection, he is afebrile and his symptoms wax/wane. Additionally, there was not a leukocytosis present in his laboratory work during his evaluation at the ED.  Plan: -Patient prefers to take Claritin, encouraged him to take Claritin daily -Patient educated on use of Flonase , Flonase  ordered          Return if symptoms worsen or fail to improve.    Aurora Lees, DO

## 2023-11-12 NOTE — Telephone Encounter (Signed)
 Pt has an appt today with Dr Sharlon Deacon.

## 2023-11-12 NOTE — Patient Instructions (Addendum)
 Thank you, [name] for allowing us  to provide your care today. Today we discussed you recent emergency department visit. I am glad that your nausea is starting to feel better.  Keep drinking water and start to eat small amounts of bland food as tolerated!  For your sinus congestion: please use claratin everyday and flonase : one squeeze per nostril daily  Please view for reminder on how to use Flonase : [email or web address]     Follow up: As needed if symptoms do not improve      Should you have any questions or concerns please call the internal medicine clinic at [phone].     Please note that our late policy has changed.  If you are more than 15 minutes late to your appointment, you may be asked to reschedule your appointment.  Dr. Sharlon Deacon, D.O. [facility]

## 2023-11-13 DIAGNOSIS — R112 Nausea with vomiting, unspecified: Secondary | ICD-10-CM | POA: Insufficient documentation

## 2023-11-13 DIAGNOSIS — T7840XA Allergy, unspecified, initial encounter: Secondary | ICD-10-CM | POA: Insufficient documentation

## 2023-11-13 NOTE — Assessment & Plan Note (Addendum)
 Per the patient, he ate a cheeseburger at lunch and then around 6 PM he felt very nauseated and started vomiting.  He denies any abdominal pain at that time.  In the emergency department, laboratory work was reassuring and he was discharged home with Zofran .  Patient reports that his nausea and vomiting started to improve this morning.  His appetite is still poor; however, he is able to drink water and keep food down now.  He reports that the Zofran  is helping him and he still has plenty.  He denies abdominal pain, diarrhea, sick contacts, fever, chills, marijuana use, alcohol use.  I suspect that his nausea and vomiting was likely due to a gastroenteritis that is now resolving. Plan: -Patient encouraged to continue hydration and to start a bland diet for a few days -Patient does not need refill for Zofran  at this time

## 2023-11-13 NOTE — Assessment & Plan Note (Signed)
 Patient reports sinus congestion that has been off and on since March when the pollen count was elevated.  He has tried Claritin and reported that he is taking it as needed.  He has not tried Flonase  yet.  Patient's symptoms are due to seasonal allergies. Low suspicion for a bacterial sinus infection, he is afebrile and his symptoms wax/wane. Additionally, there was not a leukocytosis present in his laboratory work during his evaluation at the ED.  Plan: -Patient prefers to take Claritin, encouraged him to take Claritin daily -Patient educated on use of Flonase , Flonase  ordered

## 2023-11-21 NOTE — Progress Notes (Signed)
 Internal Medicine Clinic Attending  Case discussed with the resident at the time of the visit.  We reviewed the resident's history and exam and pertinent patient test results.  I agree with the assessment, diagnosis, and plan of care documented in the resident's note.

## 2023-12-27 ENCOUNTER — Encounter: Payer: Self-pay | Admitting: *Deleted

## 2024-05-01 ENCOUNTER — Other Ambulatory Visit: Payer: Self-pay | Admitting: Student

## 2024-05-01 DIAGNOSIS — I10 Essential (primary) hypertension: Secondary | ICD-10-CM

## 2024-05-01 NOTE — Telephone Encounter (Signed)
 Copied from CRM [id number]. Topic: Clinical - Medication Refill >> May 01, 2024  9:55 AM Chiquita SQUIBB wrote: Medication:  losartan -hydrochlorothiazide  Expired - losartan -hydrochlorothiazide  (HYZAAR) 50-12.5 MG tablet   Has the patient contacted their pharmacy? Yes (Agent: If no, request that the patient contact the pharmacy for the refill. If patient does not wish to contact the pharmacy document the reason why and proceed with request.) (Agent: If yes, when and what did the pharmacy advise?)  This is the patient's preferred pharmacy:   [facility] [address], [place] - [address] [place]  [address] Phone: [phone] Fax: [phone]  Is this the correct pharmacy for this prescription? Yes If no, delete pharmacy and type the correct one.   Has the prescription been filled recently? No  Is the patient out of the medication? Yes- Patient is completely out   Has the patient been seen for an appointment in the last year OR does the patient have an upcoming appointment? Yes  Can we respond through MyChart? Yes  Agent: Please be advised that Rx refills may take up to 3 business days. We ask that you follow-up with your pharmacy.

## 2024-05-01 NOTE — Telephone Encounter (Signed)
 Attempted to contact pt with an appt for routine follow up No answer  Message left on recorder

## 2024-05-03 MED ORDER — LOSARTAN POTASSIUM-HCTZ 50-12.5 MG PO TABS: 1.0000 | ORAL_TABLET | Freq: Every day | ORAL | 2 refills | Status: AC

## 2024-05-20 ENCOUNTER — Ambulatory Visit: Payer: Self-pay

## 2024-05-20 NOTE — Telephone Encounter (Signed)
 FYI Only or Action Required?: FYI only for provider.  Patient was last seen in primary care on 11/12/2023 by Kandis Perkins, DO.  Called Nurse Triage reporting Back Pain.  Symptoms began several weeks ago.  Interventions attempted: Rest, hydration, or home remedies.  Symptoms are: gradually worsening.  Triage Disposition: See HCP Within 4 Hours (Or PCP Triage)  Patient/caregiver understands and will follow disposition?:   Copied from CRM [id number]. Topic: Clinical - Red Word Triage >> May 20, 2024  4:45 PM Hadassah PARAS wrote: Red Word that prompted transfer to Nurse Triage: worsening Back pain, abdomonial/cramping pain Reason for Disposition  [1] MILD difficulty breathing (e.g., minimal/no SOB at rest, SOB with walking, pulse < 100) AND [2] NEW-onset or WORSE than normal  Answer Assessment - Initial Assessment Questions With any activity having shortness of breath. No cold and flu symptoms-  Used to have asthma. Has inhaler still, doesn't use it. Advised to start  Catches his breath eventually once he stops to take a break.   ED/UC precautions given. Pt aware acute visit with [facility] but would like to transfer to a male MD at [facility]. TOC appt placed for April and placed on Waitlist. All questions answered  1. RESPIRATORY STATUS: Describe your breathing? (e.g., wheezing, shortness of breath, unable to speak, severe coughing)      SOB with any activity- even putting socks on.  2. ONSET: When did this breathing problem begin?      A few weeks 3. PATTERN Does the difficult breathing come and go, or has it been constant since it started?      Not consistent with stomach or back pain  4. SEVERITY: How bad is your breathing? (e.g., mild, moderate, severe)      SOB with mild activity 5. RECURRENT SYMPTOM: Have you had difficulty breathing before? If Yes, ask: When was the last time? and What happened that time?      Hx of asthma 6. CARDIAC HISTORY: Do you have any history of  heart disease? (e.g., heart attack, angina, bypass surgery, angioplasty)      HTN - has BP cuff but hasn't checked recently  7. LUNG HISTORY: Do you have any history of lung disease?  (e.g., pulmonary embolus, asthma, emphysema)     Denies  8. CAUSE: What do you think is causing the breathing problem?      unsure 9. OTHER SYMPTOMS: Do you have any other symptoms? (e.g., chest pain, cough, dizziness, fever, runny nose)     Denies  10. O2 SATURATION MONITOR:  Do you use an oxygen saturation monitor (pulse oximeter) at home? If Yes, ask: What is your reading (oxygen level) today? What is your usual oxygen saturation reading? (e.g., 95%)       Doesn't have  12. TRAVEL: Have you traveled out of the country in the last month? (e.g., travel history, exposures)       Denies  Answer Assessment - Initial Assessment Questions Abdominal pain hits randomly- achy Denies Pepto or tums use.  Shot last month   Back pain with radiating down left leg with numbness when laying down  Tylenol  and back pain- 10/10   1. LOCATION: Where does it hurt?      Bottom of stomach  2. RADIATION: Does the pain shoot anywhere else? (e.g., chest, back)     Radiates down his leg sometimes-  3. ONSET: When did the pain begin? (Minutes, hours or days ago)      Off and on for  a few weeks  4. SUDDEN: Gradual or sudden onset?     Sudden  5. PATTERN Does the pain come and go, or is it constant?     Fluctuates throughout the day with the morning being worse 6. SEVERITY: How bad is the pain?  (e.g., Scale 1-10; mild, moderate, or severe)     5-6/10 7. RECURRENT SYMPTOM: Have you ever had this type of stomach pain before? If Yes, ask: When was the last time? and What happened that time?      Denies  8. CAUSE: What do you think is causing the stomach pain? (e.g., gallstones, recent abdominal surgery)     Denies  9. RELIEVING/AGGRAVATING FACTORS: What makes it better or worse? (e.g.,  antacids, bending or twisting motion, bowel movement)     Waits it out 10. OTHER SYMPTOMS: Do you have any other symptoms? (e.g., back pain, diarrhea, fever, urination pain, vomiting)       Back pain  Answer Assessment - Initial Assessment Questions UTI negative when this first started  Sharp shooting pain down left leg when laying down.   1. ONSET: When did the pain begin? (e.g., minutes, hours, days)     This time Saturday 2. LOCATION: Where does it hurt? (upper, mid or lower back)     Lower left back above his buttocks.  3. SEVERITY: How bad is the pain?  (e.g., Scale 1-10; mild, moderate, or severe)     10/10  4. PATTERN: Is the pain constant? (e.g., yes, no; constant, intermittent)     Intermittent  5. RADIATION: Does the pain shoot into your legs or somewhere else?     Down his  6. CAUSE:  What do you think is causing the back pain?      Unsure  7. BACK OVERUSE:  Any recent lifting of heavy objects, strenuous work or exercise?   Truck driver- sits a lot  8. MEDICINES: What have you taken so far for the pain? (e.g., nothing, acetaminophen , NSAIDS)     Tylenol - minimal relief 9. NEUROLOGIC SYMPTOMS: Do you have any weakness, numbness, or problems with bowel/bladder control?     Numbness when laying down  10. OTHER SYMPTOMS: Do you have any other symptoms? (e.g., fever, abdomen pain, burning with urination, blood in urine)       Abdominal cramping, pain  Protocols used: Abdominal Pain - Male-A-AH, Back Pain-A-AH, Breathing Difficulty-A-AH

## 2024-05-21 ENCOUNTER — Ambulatory Visit: Payer: Self-pay | Admitting: Student

## 2024-05-21 ENCOUNTER — Ambulatory Visit: Admitting: Family Medicine

## 2024-05-21 VITALS — BP 153/84 | HR 86 | Temp 98.5°F | Ht 66.0 in | Wt 198.0 lb

## 2024-05-21 DIAGNOSIS — F17210 Nicotine dependence, cigarettes, uncomplicated: Secondary | ICD-10-CM

## 2024-05-21 DIAGNOSIS — M545 Low back pain, unspecified: Secondary | ICD-10-CM | POA: Diagnosis present

## 2024-05-21 MED ORDER — NAPROXEN 500 MG PO TABS: 500.0000 mg | ORAL_TABLET | Freq: Two times a day (BID) | ORAL | 0 refills | Status: AC

## 2024-05-21 NOTE — Progress Notes (Signed)
 Internal Medicine Clinic Attending  Case discussed with the resident at the time of the visit.  We reviewed the resident's history and exam and pertinent patient test results.  I agree with the assessment, diagnosis, and plan of care documented in the resident's note.

## 2024-05-21 NOTE — Assessment & Plan Note (Signed)
 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 Pain persistent, worsens with movement, not debilitating. Unresponsive to acetaminophen  or BC powders. - Prescribe anti-inflammatory medications for pain management. - Recommend lumbar support pillow for use in truck. - Advise on regular stretching exercises to strengthen back muscles. - Encourage stopping every hour during driving to stretch.

## 2024-05-21 NOTE — Progress Notes (Signed)
 CC: Low back pain   HPI:  Gregory Jones is a 33 y.o. male living with a history stated below and presents today for low back pain. Please see problem based assessment and plan for additional details.  Discussed the use of AI scribe software for clinical note transcription with the patient, who gave verbal consent to proceed.  History of Present Illness Gregory Jones is a 33 year old male who presents with worsening left-sided back pain.  He has been experiencing left-sided back pain since Saturday. The pain was initially sore but has progressively worsened, making it difficult to walk and lay down. It is localized to the left side of his back without radiation. No specific activity triggered the pain, and it is not relieved by over-the-counter medications such as Tylenol  and BC powders.  He experienced similar back pain about a month ago, for which he received an injection during a medical appointment, but it did not alleviate the pain. The pain eventually became tolerable on its own. Currently, the pain is exacerbated by standing for prolonged periods and sitting, but not significantly worsened by walking. He finds it difficult to lay on his right side due to sharp pain and prefers to lay on his stomach.  No changes in urination, bowel habits, fevers, or chills. He feels tired all the time, which he attributes to the pain.  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 He mentions stopping every hour during his drives. He does not use any specific back support in his truck, aside from the air in the seat.  He is currently taking Hyzaar for high blood pressure, which he took at 7:30 AM today.    Past Medical History:  Diagnosis Date   Hypertension     Current Outpatient Medications on File Prior to Visit  Medication Sig Dispense Refill   albuterol  (VENTOLIN  HFA) 108 (90 Base) MCG/ACT inhaler Inhale 2 puffs into  the lungs every 6 (six) hours as needed for wheezing or shortness of breath. 8 g 2   buPROPion  ER (WELLBUTRIN  SR) 100 MG 12 hr tablet Take 1 tablet (100 mg total) by mouth 2 (two) times daily. 60 tablet 2   fluticasone  (FLONASE ) 50 MCG/ACT nasal spray Place 1 spray into both nostrils daily. 64 mL 2   losartan -hydrochlorothiazide  (HYZAAR) 50-12.5 MG tablet Take 1 tablet by mouth daily. 90 tablet 2   ondansetron  (ZOFRAN -ODT) 4 MG disintegrating tablet 4mg  ODT q4 hours prn nausea/vomit 20 tablet 0   No current facility-administered medications on file prior to visit.    Family History  Problem Relation Age of Onset   Cancer Neg Hx    Diabetes Neg Hx    Heart failure Neg Hx    Hyperlipidemia Neg Hx    Hypertension Neg Hx     Social History   Socioeconomic History   Marital status: Single    Spouse name: Not on file   Number of children: Not on file   Years of education: Not on file   Highest education level: Not on file  Occupational History   Not on file  Tobacco Use   Smoking status: Every Day    Current packs/day: 2.00    Average packs/day: 2.0 packs/day for 5.0 years (10.0 ttl pk-yrs)    Types: Cigarettes   Smokeless tobacco: Never  Substance and Sexual Activity   Alcohol use: Yes    Alcohol/week: 0.0 standard  drinks of alcohol    Comment: occasionally on the weekend   Drug use: No   Sexual activity: Not on file  Other Topics Concern   Not on file  Social History Narrative   Not on file   Social Drivers of Health   Financial Resource Strain: Not on file  Food Insecurity: No Food Insecurity (09/21/2022)   Hunger Vital Sign    Worried About Running Out of Food in the Last Year: Never true    Ran Out of Food in the Last Year: Never true  Transportation Needs: No Transportation Needs (09/21/2022)   PRAPARE - Administrator, Civil Service (Medical): No    Lack of Transportation (Non-Medical): No  Physical Activity: Not on file  Stress: Not on file  Social  Connections: Socially Isolated (09/21/2022)   Social Connection and Isolation Panel    Frequency of Communication with Friends and Family: More than three times a week    Frequency of Social Gatherings with Friends and Family: Once a week    Attends Religious Services: Never    Database Administrator or Organizations: No    Attends Banker Meetings: Never    Marital Status: Never married  Intimate Partner Violence: Not At Risk (09/21/2022)   Humiliation, Afraid, Rape, and Kick questionnaire    Fear of Current or Ex-Partner: No    Emotionally Abused: No    Physically Abused: No    Sexually Abused: No    Review of Systems: ROS negative except for what is noted on the assessment and plan.  Vitals:   05/21/24 0826  BP: (!) 153/84  Pulse: 86  Temp: 98.5 F (36.9 C)  TempSrc: Oral  SpO2: 100%  Weight: 198 lb (89.8 kg)  Height: 5' 6 (1.676 m)    Physical Exam: Constitutional: well-appearing male in no acute distress Cardiovascular: regular rate and rhythm, no m/r/g Pulmonary/Chest: normal work of breathing on room air, lungs clear to auscultation bilaterally MSK: normal bulk and tone, mild tenderness to palpation in the left lower back, full range of motion of spine Neurological: alert & oriented x 3, 5/5 strength in bilateral upper and lower extremities, normal gait   Assessment & Plan:   Low back pain 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 Pain persistent, worsens with movement, not debilitating. Unresponsive to acetaminophen  or BC powders. - Prescribe anti-inflammatory medications for pain management. - Recommend lumbar support pillow for use in truck. - Advise on regular stretching exercises to strengthen back muscles. - Encourage stopping every hour during driving to stretch.   Patient discussed with Dr. Winfrey   Gracyn Allor, M.D. [facility] Internal Medicine, PGY-3 Pager: [phone] Date 05/21/2024 Time 10:13 AM

## 2024-05-21 NOTE — Patient Instructions (Addendum)
 Thank you so much for coming to the clinic today!   Please do the stretches we discussed I am also sending in a short course of pain medications to help in the meanwhile    If you have any questions please feel free to the call the clinic at anytime at [phone]. It was a pleasure seeing you!  Best, Dr. Mansour Balboa

## 2024-07-10 ENCOUNTER — Encounter: Payer: Self-pay | Admitting: Dermatology

## 2024-07-10 ENCOUNTER — Ambulatory Visit: Admitting: Dermatology

## 2024-07-10 DIAGNOSIS — R208 Other disturbances of skin sensation: Secondary | ICD-10-CM | POA: Diagnosis not present

## 2024-07-10 MED ORDER — PIMECROLIMUS 1 % EX CREA: TOPICAL_CREAM | CUTANEOUS | 3 refills | Status: AC

## 2024-07-10 NOTE — Patient Instructions (Addendum)
 SABRA

## 2024-07-10 NOTE — Progress Notes (Signed)
° °  New Patient Visit   Subjective  Gregory Jones is a 33 y.o. male who presents for the following: patient reports testicles burning since this summer, worse when sweating. He was seen at a 01/06/2024, 04/12/24, 06/07/24, [facility] urgent care and was prescribed Nystatin/TMC topical oint, Fluconazole and Hydroxyzine, his symptoms have not improved. Patient has changed laundry detergent and changed underwear brands.  Patient uses clippers to shave area, has been doing this for years with no problems.  He was then referred to dermatology.    The following portions of the chart were reviewed this encounter and updated as appropriate: medications, allergies, medical history  Review of Systems:  No other skin or systemic complaints except as noted in HPI or Assessment and Plan.  Objective  Well appearing patient in no apparent distress; mood and affect are within normal limits.   A focused examination was performed of the following areas: groin  Relevant exam findings are noted in the Assessment and Plan.    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 Discussed unclear etiology of red scrotum syndrome but association with topical steroid use Recommend Body Glide to apply to area Start Elidel  cr qd/bid aa groin prn flares Start otc Lidocaine  cream prn flares Discussed bx if not improving Stop steroid use  DYSESTHESIA    Return in about 1 month (around 08/10/2024) for groing f/u.  I, Grayce Saunas, RMA, am acting as scribe for Boneta Sharps, MD .   Documentation: I have reviewed the above documentation for accuracy and completeness, and I agree with the above.  Boneta Sharps, MD

## 2024-08-14 ENCOUNTER — Ambulatory Visit: Admitting: Dermatology

## 2024-08-14 ENCOUNTER — Encounter: Payer: Self-pay | Admitting: Dermatology

## 2024-08-14 DIAGNOSIS — L280 Lichen simplex chronicus: Secondary | ICD-10-CM | POA: Diagnosis not present

## 2024-08-14 DIAGNOSIS — D492 Neoplasm of unspecified behavior of bone, soft tissue, and skin: Secondary | ICD-10-CM

## 2024-08-14 NOTE — Progress Notes (Signed)
" ° °  Follow Up Visit   Subjective  Gregory Jones is a 34 y.o. male who presents for the following: 1 month follow up. Red scrotum syndrome. Using Elidel  cream as directed. States condition is the same. No worsening, no improvement.   The following portions of the chart were reviewed this encounter and updated as appropriate: medications, allergies, medical history  Review of Systems:  No other skin or systemic complaints except as noted in HPI or Assessment and Plan.  Objective  Well appearing patient in no apparent distress; mood and affect are within normal limits.  A focused examination was performed of the following areas: Pubic, groin  Relevant exam findings are noted in the Assessment and Plan.    Assessment & Plan   NEOPLASM OF SKIN scrotum - Skin / nail biopsy Type of biopsy: tangential   Informed consent: discussed and consent obtained   Timeout: patient name, date of birth, surgical site, and procedure verified   Procedure prep:  Patient was prepped and draped in usual sterile fashion Prep type:  Isopropyl alcohol Anesthesia: the lesion was anesthetized in a standard fashion   Anesthetic:  1% lidocaine  w/ epinephrine 1-100,000 buffered w/ 8.4% NaHCO3 Instrument used: scissors   Hemostasis achieved with: pressure and aluminum chloride   Outcome: patient tolerated procedure well   Post-procedure details: sterile dressing applied and wound care instructions given   Dressing type: bandage and petrolatum    Specimen 1 - Surgical pathology Differential Diagnosis: lichen planus vs candidiasis vs HSV vs red scrotum syndrome vs irritant contact dermatitis  Check Margins: No Left proximal scrotum   Return for follow up pending pathology results.  I, Jill Parcell, CMA, am acting as scribe for Boneta Sharps, MD.   Documentation: I have reviewed the above documentation for accuracy and completeness, and I agree with the above.  Boneta Sharps, MD  "

## 2024-08-14 NOTE — Patient Instructions (Signed)
 Wound Care Instructions  Cleanse wound gently with soap and water once a day then pat dry with clean gauze. Apply a thin coat of Petrolatum (petroleum jelly, Vaseline) over the wound (unless you have an allergy to this). We recommend that you use a new, sterile tube of Vaseline. Do not pick or remove scabs. Do not remove the yellow or white healing tissue from the base of the wound.   You should call the office for your biopsy report after 1 week if you have not already been contacted.  If you experience any problems, such as abnormal amounts of bleeding, swelling, significant bruising, significant pain, or evidence of infection, please call the office immediately.  FOR ADULT SURGERY PATIENTS: If you need something for pain relief you may take 1 extra strength Tylenol  (acetaminophen ) AND 2 Ibuprofen (200mg  each) together every 4 hours as needed for pain. (do not take these if you are allergic to them or if you have a reason you should not take them.) Typically, you may only need pain medication for 1 to 3 days.      Due to recent changes in healthcare laws, you may see results of your pathology and/or laboratory studies on MyChart before the doctors have had a chance to review them. We understand that in some cases there may be results that are confusing or concerning to you. Please understand that not all results are received at the same time and often the doctors may need to interpret multiple results in order to provide you with the best plan of care or course of treatment. Therefore, we ask that you please give us  2 business days to thoroughly review all your results before contacting the office for clarification. Should we see a critical lab result, you will be contacted sooner.   If You Need Anything After Your Visit  If you have any questions or concerns for your doctor, please call our main line at [phone] and press option 4 to reach your doctor's medical assistant. If no one  answers, please leave a voicemail as directed and we will return your call as soon as possible. Messages left after 4 pm will be answered the following business day.   You may also send us  a message via MyChart. We typically respond to MyChart messages within 1-2 business days.  For prescription refills, please ask your pharmacy to contact our office. Our fax number is [phone].  If you have an urgent issue when the clinic is closed that cannot wait until the next business day, you can page your doctor at the number below.    Please note that while we do our best to be available for urgent issues outside of office hours, we are not available 24/7.   If you have an urgent issue and are unable to reach us , you may choose to seek medical care at your doctor's office, retail clinic, urgent care center, or emergency room.  If you have a medical emergency, please immediately call 911 or go to the emergency department.  Pager Numbers  - Dr. Hester: [phone]  - Dr. Jackquline: [phone]  - Dr. Claudene: [phone]   - Dr. Raymund: [phone]  In the event of inclement weather, please call our main line at [phone] for an update on the status of any delays or closures.  Dermatology Medication Tips: Please keep the boxes that topical medications come in in order to help keep track of the instructions about where and how to use these. Pharmacies typically  print the medication instructions only on the boxes and not directly on the medication tubes.   If your medication is too expensive, please contact our office at [phone] option 4 or send us  a message through MyChart.   We are unable to tell what your co-pay for medications will be in advance as this is different depending on your insurance coverage. However, we may be able to find a substitute medication at lower cost or fill out paperwork to get insurance to cover a needed medication.   If a prior authorization is required  to get your medication covered by your insurance company, please allow us  1-2 business days to complete this process.  Drug prices often vary depending on where the prescription is filled and some pharmacies may offer cheaper prices.  The website www.goodrx.com contains coupons for medications through different pharmacies. The prices here do not account for what the cost may be with help from insurance (it may be cheaper with your insurance), but the website can give you the price if you did not use any insurance.  - You can print the associated coupon and take it with your prescription to the pharmacy.  - You may also stop by our office during regular business hours and pick up a GoodRx coupon card.  - If you need your prescription sent electronically to a different pharmacy, notify our office through [facility] or by phone at [phone] option 4.     Si Usted Necesita Algo Despus de Su Visita  Tambin puede enviarnos un mensaje a travs de Clinical Cytogeneticist. Por lo general respondemos a los mensajes de MyChart en el transcurso de 1 a 2 das hbiles.  Para renovar recetas, por favor pida a su farmacia que se ponga en contacto con nuestra oficina. Randi lakes de fax es [place] [phone].  Si tiene un asunto urgente cuando la clnica est cerrada y que no puede esperar hasta el siguiente da hbil, puede llamar/localizar a su doctor(a) al nmero que aparece a continuacin.   Por favor, tenga en cuenta que aunque hacemos todo lo posible para estar disponibles para asuntos urgentes fuera del horario de [place], no estamos disponibles las 24 horas del da, los 7 [address]  [address] de la [place].   Si tiene un problema urgente y no puede comunicarse con nosotros, puede optar por buscar atencin mdica  en el consultorio de su doctor(a), en una clnica privada, en un centro de atencin urgente o en una sala de emergencias.  Si tiene engineer, drilling, por favor llame inmediatamente al 911 o vaya a la sala  de emergencias.  Nmeros de bper  - Dr. Hester: [phone]  - Dra. Jackquline: [phone]  - Dr. Claudene: [phone]  - Dra. Kitts: [phone]  En caso de inclemencias del [place], por favor llame a nuestra lnea principal al [phone] para una actualizacin sobre el estado de cualquier retraso o cierre.  Consejos para la medicacin en dermatologa: Por favor, guarde las cajas en las que vienen los medicamentos de uso tpico para ayudarle a seguir las instrucciones sobre dnde y cmo usarlos. Las farmacias generalmente imprimen las instrucciones del medicamento slo en las cajas y no directamente en los tubos del [place].   Si su medicamento es muy caro, por favor, pngase en contacto con landry rieger llamando al [phone] y presione la opcin 4 o envenos un mensaje a travs de Clinical Cytogeneticist.   No podemos decirle cul ser su copago por los medicamentos por adelantado ya que esto es diferente dependiendo  de la cobertura de su seguro. Sin embargo, es posible que podamos encontrar un medicamento sustituto a audiological scientist un formulario para que el seguro cubra el medicamento que se considera necesario.   Si se requiere una autorizacin previa para que su compaa de seguros cubra su medicamento, por favor permtanos de 1 a 2 das hbiles para completar este proceso.  Los precios de los medicamentos varan con frecuencia dependiendo del environmental consultant de dnde se surte la receta y alguna farmacias pueden ofrecer precios ms baratos.  El sitio web www.goodrx.com tiene cupones para medicamentos de health and safety inspector. Los precios aqu no tienen en cuenta lo que podra costar con la ayuda del seguro (puede ser ms barato con su seguro), pero el sitio web puede darle el precio si no utiliz tourist information centre manager.  - Puede imprimir el cupn correspondiente y llevarlo con su receta a la farmacia.  - Tambin puede pasar por nuestra oficina durante el horario de atencin regular y education officer, museum una  tarjeta de cupones de GoodRx.  - Si necesita que su receta se enve electrnicamente a una farmacia diferente, informe a nuestra oficina a travs de MyChart de [place] o por telfono llamando al [phone] y presione la opcin 4.

## 2024-08-20 LAB — SURGICAL PATHOLOGY

## 2024-08-21 ENCOUNTER — Ambulatory Visit: Payer: Self-pay | Admitting: Dermatology

## 2024-11-12 ENCOUNTER — Encounter: Admitting: Nurse Practitioner
